# Patient Record
Sex: Female | Born: 2000 | Race: White | Hispanic: No | Marital: Married | Smoking: Current some day smoker
Health system: Southern US, Community
[De-identification: ages and names within clinical notes are randomized; demographics above are authoritative.]

## PROBLEM LIST (undated history)

## (undated) DIAGNOSIS — F32A Depression, unspecified: Secondary | ICD-10-CM

## (undated) DIAGNOSIS — F419 Anxiety disorder, unspecified: Secondary | ICD-10-CM

## (undated) HISTORY — DX: Depression, unspecified: F32.A

## (undated) HISTORY — DX: Anxiety disorder, unspecified: F41.9

---
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
## 2020-12-19 ENCOUNTER — Encounter: Payer: Self-pay | Admitting: Advanced Practice Midwife

## 2020-12-19 ENCOUNTER — Other Ambulatory Visit: Payer: Self-pay

## 2020-12-19 ENCOUNTER — Ambulatory Visit (INDEPENDENT_AMBULATORY_CARE_PROVIDER_SITE_OTHER): Payer: BC Managed Care – PPO | Admitting: Advanced Practice Midwife

## 2020-12-19 ENCOUNTER — Other Ambulatory Visit (HOSPITAL_COMMUNITY)
Admission: RE | Admit: 2020-12-19 | Discharge: 2020-12-19 | Disposition: A | Discharge status: Home / Self Care | Payer: BC Managed Care – PPO | Source: Ambulatory Visit | Attending: Advanced Practice Midwife | Admitting: Advanced Practice Midwife

## 2020-12-19 VITALS — BP 120/80 | Ht 68.0 in | Wt 161.0 lb

## 2020-12-19 DIAGNOSIS — Z30432 Encounter for removal of intrauterine contraceptive device: Secondary | ICD-10-CM

## 2020-12-19 DIAGNOSIS — Z124 Encounter for screening for malignant neoplasm of cervix: Secondary | ICD-10-CM | POA: Insufficient documentation

## 2020-12-19 DIAGNOSIS — Z30011 Encounter for initial prescription of contraceptive pills: Secondary | ICD-10-CM

## 2020-12-19 MED ORDER — NORGESTIMATE-ETH ESTRADIOL 0.25-35 MG-MCG PO TABS: 1.0000 | ORAL_TABLET | Freq: Every day | ORAL | 11 refills | Status: DC

## 2020-12-19 NOTE — Progress Notes (Signed)
Patient ID: Danielle Sheppard, female   DOB: [birth year], 20 y.o.   MRN: [id number]  Reason for Consult: Contraception   Subjective:  HPI:  Danielle Sheppard is a 20 y.o. female being seen for removal of Mirena IUD that was placed 6 years ago after the birth of her first baby. She was not aware of the new 7 year approval, however, she would still like the IUD removed so she can start OCP which will give her more flexibility with regards to timing of conception. She accepts PAP smear today as she is now 58.   She mentions frequent UTIs and thinks she may have one now. Her symptoms started 2 months ago and are frequency and strong smelling/dark colored urine. She denies adequate hydration. We discussed her leaving a urine specimen for analysis and culture as needed and prescribing medication if UA indicates. She was agreeable, however, she did not leave a urine specimen. We contacted her after the visit and she prefers to try increasing hydration to recommended clear/light yellow before possibly coming back for UA.   History reviewed. No pertinent past medical history. History reviewed. No pertinent family history. Past Surgical History:  Procedure Laterality Date  . CESAREAN SECTION N/A     Short Social History:  Social History   Tobacco Use  . Smoking status: Current Some Day Smoker  . Smokeless tobacco: Never Used  Substance Use Topics  . Alcohol use: No    No Known Allergies  Current Outpatient Medications  Medication Sig Dispense Refill  . norgestimate-ethinyl estradiol (ORTHO-CYCLEN) 0.25-35 MG-MCG tablet Take 1 tablet by mouth daily. 30 tablet 11  . sertraline (ZOLOFT) 50 MG tablet Take 50 mg by mouth daily. (Patient not taking: Reported on 12/19/2020)    . traZODone (DESYREL) 50 MG tablet Take 1 tablet (50 mg total) by mouth at bedtime. (Patient not taking: Reported on 12/19/2020) 30 tablet 0   No current facility-administered medications for this visit.   Review of Systems   Constitutional: Negative for chills and fever.  HENT: Negative for congestion, ear discharge, ear pain, hearing loss, sinus pain and sore throat.   Eyes: Negative for blurred vision and double vision.  Respiratory: Negative for cough, shortness of breath and wheezing.   Cardiovascular: Negative for chest pain, palpitations and leg swelling.  Gastrointestinal: Negative for abdominal pain, blood in stool, constipation, diarrhea, heartburn, melena, nausea and vomiting.  Genitourinary: Positive for frequency. Negative for dysuria, flank pain, hematuria and urgency.       Positive for strong smelling/dark colored urine  Musculoskeletal: Negative for back pain, joint pain and myalgias.  Skin: Negative for itching and rash.  Neurological: Negative for dizziness, tingling, tremors, sensory change, speech change, focal weakness, seizures, loss of consciousness, weakness and headaches.  Endo/Heme/Allergies: Negative for environmental allergies. Does not bruise/bleed easily.  Psychiatric/Behavioral: Negative for depression, hallucinations, memory loss, substance abuse and suicidal ideas. The patient is not nervous/anxious and does not have insomnia.         Objective:  Objective   Vitals:   12/19/20 1042  BP: 120/80  Weight: 161 lb (73 kg)  Height: 5\' 8"  (1.727 m)   Body mass index is 24.48 kg/m. Constitutional: Well nourished, well developed female in no acute distress.  HEENT: normal Skin: Warm and dry.  Cardiovascular: Regular rate and rhythm.   Extremity: no edema  Respiratory: Clear to auscultation bilateral. Normal respiratory effort Abdomen: soft, nontender, nondistended, no abnormal masses, no epigastric pain Back: no CVAT Neuro: DTRs 2+,  Cranial nerves grossly intact Psych: Alert and Oriented x3. No memory deficits. Normal mood and affect.  MS: normal gait, normal bilateral lower extremity ROM/strength/stability.  Pelvic exam:  is not limited by body habitus EGBUS: within  normal limits Vagina: within normal limits and with normal mucosa  Cervix: normal appearance, IUD strings visualized and grasped with curved forceps. IUD easily removed intact. PAP smear specimen collected. Patient tolerated procedure well.    Assessment/Plan:     19 y.o. G1 P50 female IUD removal, start OCP birth control  Rx Sprintec PAP smear today Follow up as needed for UTI symptoms, increase hydration   Tresea Mall CNM [facility] 12/19/2020, 11:50 AM

## 2020-12-25 LAB — CYTOLOGY - PAP
Comment: NEGATIVE
Diagnosis: UNDETERMINED — AB
High risk HPV: NEGATIVE

## 2021-02-26 ENCOUNTER — Other Ambulatory Visit: Payer: Self-pay

## 2021-02-26 ENCOUNTER — Other Ambulatory Visit (HOSPITAL_COMMUNITY)
Admission: RE | Admit: 2021-02-26 | Discharge: 2021-02-26 | Disposition: A | Discharge status: Home / Self Care | Payer: BC Managed Care – PPO | Source: Ambulatory Visit | Attending: Obstetrics and Gynecology | Admitting: Obstetrics and Gynecology

## 2021-02-26 ENCOUNTER — Encounter: Payer: Self-pay | Admitting: Obstetrics and Gynecology

## 2021-02-26 ENCOUNTER — Ambulatory Visit (INDEPENDENT_AMBULATORY_CARE_PROVIDER_SITE_OTHER): Payer: BC Managed Care – PPO | Admitting: Obstetrics and Gynecology

## 2021-02-26 VITALS — BP 100/70 | Ht 68.0 in | Wt 154.0 lb

## 2021-02-26 DIAGNOSIS — Z113 Encounter for screening for infections with a predominantly sexual mode of transmission: Secondary | ICD-10-CM

## 2021-02-26 DIAGNOSIS — N921 Excessive and frequent menstruation with irregular cycle: Secondary | ICD-10-CM

## 2021-02-26 DIAGNOSIS — R350 Frequency of micturition: Secondary | ICD-10-CM | POA: Diagnosis not present

## 2021-02-26 LAB — POCT URINALYSIS DIPSTICK
Bilirubin, UA: NEGATIVE
Blood, UA: NEGATIVE
Glucose, UA: NEGATIVE
Ketones, UA: NEGATIVE
Leukocytes, UA: NEGATIVE
Nitrite, UA: NEGATIVE
Protein, UA: NEGATIVE
Spec Grav, UA: 1.02 (ref 1.010–1.025)
pH, UA: 6 (ref 5.0–8.0)

## 2021-02-26 NOTE — Progress Notes (Signed)
Patient, No Pcp Per (Inactive)   Chief Complaint  Patient presents with  . Vaginal Bleeding    Spotting and cramping, feels bloated, since May 30, neg UPT 3 days ago    HPI:      Danielle Sheppard is a 20 y.o. G1P1001 whose LMP was Patient's last menstrual period was 02/08/2021 (exact date)., presents today for BTB, cramping for a few days. Has urinary frequency with decreased flow, no dysuria, hematuria, LBP, fevers. Drinks a lot of caffeine. Has had mild diarrhea for a few days, no n/v. Menses were monthly, lasting 5 days, mod flow, no BTB, mild dysmen the past yr with IUD and since removal. IUD removed 3/22, planned to start OCPs. Took for 5 days, then stopped. Would rather not be on BC. Conception ok. Had neg UPT a few days ago. She is sex active with fiance, no new partners. No pain/bleeding usually with sex but did have some bleeding a couple days ago since spotting started.  Had ASCUS pap with neg HPV DNA 3/22 but no recent STD testing done.  Past Medical History:  Diagnosis Date  . Anxiety   . Depression     Past Surgical History:  Procedure Laterality Date  . CESAREAN SECTION N/A     History reviewed. No pertinent family history.  Social History   Socioeconomic History  . Marital status: Single    Spouse name: Not on file  . Number of children: Not on file  . Years of education: Not on file  . Highest education level: Not on file  Occupational History  . Not on file  Tobacco Use  . Smoking status: Current Some Day Smoker  . Smokeless tobacco: Never Used  Vaping Use  . Vaping Use: Every day  Substance and Sexual Activity  . Alcohol use: No  . Drug use: Never  . Sexual activity: Yes    Birth control/protection: None  Other Topics Concern  . Not on file  Social History Narrative  . Not on file   Social Determinants of Health   Financial Resource Strain: Not on file  Food Insecurity: Not on file  Transportation Needs: Not on file  Physical  Activity: Not on file  Stress: Not on file  Social Connections: Not on file  Intimate Partner Violence: Not on file    Outpatient Medications Prior to Visit  Medication Sig Dispense Refill  . norgestimate-ethinyl estradiol (ORTHO-CYCLEN) 0.25-35 MG-MCG tablet Take 1 tablet by mouth daily. (Patient not taking: Reported on 02/26/2021) 30 tablet 11  . sertraline (ZOLOFT) 50 MG tablet Take 50 mg by mouth daily. (Patient not taking: Reported on 12/19/2020)    . traZODone (DESYREL) 50 MG tablet Take 1 tablet (50 mg total) by mouth at bedtime. (Patient not taking: Reported on 12/19/2020) 30 tablet 0   No facility-administered medications prior to visit.      ROS:  Review of Systems  Constitutional: Negative for fever.  Gastrointestinal: Negative for blood in stool, constipation, diarrhea, nausea and vomiting.  Genitourinary: Positive for frequency, pelvic pain and vaginal bleeding. Negative for dyspareunia, dysuria, flank pain, hematuria, urgency, vaginal discharge and vaginal pain.  Musculoskeletal: Negative for back pain.  Skin: Negative for rash.    OBJECTIVE:   Vitals:  BP 100/70   Ht 5\' 8"  (1.727 m)   Wt 154 lb (69.9 kg)   LMP 02/08/2021 (Exact Date)   BMI 23.42 kg/m   Physical Exam Vitals reviewed.  Constitutional:  Appearance: She is well-developed.  Pulmonary:     Effort: Pulmonary effort is normal.  Genitourinary:    General: Normal vulva.     Pubic Area: No rash.      Labia:        Right: No rash, tenderness or lesion.        Left: No rash, tenderness or lesion.      Vagina: No vaginal discharge, erythema, tenderness or bleeding.     Cervix: Normal.     Uterus: Normal. Not enlarged and not tender.      Adnexa: Right adnexa normal and left adnexa normal.       Right: No mass or tenderness.         Left: No mass or tenderness.    Musculoskeletal:        General: Normal range of motion.     Cervical back: Normal range of motion.  Skin:    General: Skin is  warm and dry.  Neurological:     General: No focal deficit present.     Mental Status: She is alert and oriented to person, place, and time.  Psychiatric:        Mood and Affect: Mood normal.        Behavior: Behavior normal.        Thought Content: Thought content normal.        Judgment: Judgment normal.     Results: Results for orders placed or performed in visit on 02/26/21 (from the past 24 hour(s))  POCT Urinalysis Dipstick     Status: Normal   Collection Time: 02/26/21  2:44 PM  Result Value Ref Range   Color, UA yellow    Clarity, UA clear    Glucose, UA Negative Negative   Bilirubin, UA neg    Ketones, UA neg    Spec Grav, UA 1.020 1.010 - 1.025   Blood, UA neg    pH, UA 6.0 5.0 - 8.0   Protein, UA Negative Negative   Urobilinogen, UA     Nitrite, UA neg    Leukocytes, UA Negative Negative   Appearance     Odor       Assessment/Plan: Breakthrough bleeding - Plan: Cervicovaginal ancillary only; neg exam, no bleeding on exam. Rule out STDs. If neg, question ovulatory spotting. Check first AM UPT if no/light menses later this mo. Declines BC. F/u prn.   Screening for STD (sexually transmitted disease) - Plan: Cervicovaginal ancillary only  Urinary frequency - Plan: POCT Urinalysis Dipstick; neg UA. D/C caffeine, increase water. F/u prn.     Return if symptoms worsen or fail to improve.  Anny Sayler B. Kadance Mccuistion, PA-C 02/26/2021 2:47 PM

## 2021-02-26 NOTE — Patient Instructions (Signed)
I value your feedback and you entrusting us with your care. If you get a [facility] patient survey, I would appreciate you taking the time to let us know about your experience today. Thank you! ? ? ?

## 2021-02-28 ENCOUNTER — Encounter: Payer: Self-pay | Admitting: Obstetrics and Gynecology

## 2021-02-28 LAB — CERVICOVAGINAL ANCILLARY ONLY
Chlamydia: NEGATIVE
Comment: NEGATIVE
Comment: NORMAL
Neisseria Gonorrhea: NEGATIVE

## 2021-05-02 ENCOUNTER — Encounter: Payer: BC Managed Care – PPO | Admitting: Advanced Practice Midwife

## 2021-06-09 ENCOUNTER — Encounter: Payer: BC Managed Care – PPO | Admitting: Advanced Practice Midwife

## 2021-07-01 ENCOUNTER — Encounter: Payer: Self-pay | Admitting: Advanced Practice Midwife

## 2021-07-01 ENCOUNTER — Ambulatory Visit (INDEPENDENT_AMBULATORY_CARE_PROVIDER_SITE_OTHER): Payer: BC Managed Care – PPO | Admitting: Advanced Practice Midwife

## 2021-07-01 ENCOUNTER — Other Ambulatory Visit: Payer: Self-pay

## 2021-07-01 ENCOUNTER — Other Ambulatory Visit (HOSPITAL_COMMUNITY)
Admission: RE | Admit: 2021-07-01 | Discharge: 2021-07-01 | Disposition: A | Discharge status: Home / Self Care | Payer: BC Managed Care – PPO | Source: Ambulatory Visit | Attending: Advanced Practice Midwife | Admitting: Advanced Practice Midwife

## 2021-07-01 VITALS — BP 124/78 | HR 122 | Wt 172.0 lb

## 2021-07-01 DIAGNOSIS — Z113 Encounter for screening for infections with a predominantly sexual mode of transmission: Secondary | ICD-10-CM | POA: Diagnosis present

## 2021-07-01 DIAGNOSIS — Z3480 Encounter for supervision of other normal pregnancy, unspecified trimester: Secondary | ICD-10-CM | POA: Insufficient documentation

## 2021-07-01 DIAGNOSIS — N926 Irregular menstruation, unspecified: Secondary | ICD-10-CM

## 2021-07-01 DIAGNOSIS — Z369 Encounter for antenatal screening, unspecified: Secondary | ICD-10-CM | POA: Diagnosis present

## 2021-07-01 DIAGNOSIS — O99332 Smoking (tobacco) complicating pregnancy, second trimester: Secondary | ICD-10-CM

## 2021-07-01 DIAGNOSIS — Z3A20 20 weeks gestation of pregnancy: Secondary | ICD-10-CM

## 2021-07-01 LAB — POCT URINE PREGNANCY: Preg Test, Ur: POSITIVE — AB

## 2021-07-01 MED ORDER — NICOTINE 14 MG/24HR TD PT24: 14.0000 mg | MEDICATED_PATCH | Freq: Every day | TRANSDERMAL | 0 refills | Status: DC

## 2021-07-01 NOTE — Progress Notes (Signed)
NOB - no concerns. RM 5 °

## 2021-07-01 NOTE — Patient Instructions (Signed)

## 2021-07-02 NOTE — Progress Notes (Signed)
New Obstetric Patient H&P  Date of Service: 07/01/2021  Chief Complaint: "Desires prenatal care"   History of Present Illness: Patient is a 20 y.o. G2P1001 Not Hispanic or Latino female, presents with amenorrhea and positive home pregnancy test. Patient's last menstrual period was 02/08/2021 (within days). and based on her  LMP, her EDD is Estimated Date of Delivery: 11/15/21 and her EGA is [redacted]w[redacted]d. Cycles are 5 days, regular, and occur approximately every : 28 days. Her last pap smear was 6 months ago and was ASCUS with NEGATIVE high risk HPV.    She had a urine pregnancy test which was positive 2 or 3 month(s)  ago. Her last menstrual period was normal and lasted for  5 day(s). Since her LMP she claims she has experienced breast tenderness, fatigue, nausea, vomiting. She denies vaginal bleeding. Her past medical history is noncontributory. Her prior pregnancies are notable for  FT c/s for fetal heart rate indication, 2016, female 7#6oz.  Since her LMP, she admits to the use of tobacco products  she is vaping and is trying to quit- requesting assistance (patches) She claims she has gained  14  pounds since the start of her pregnancy.  There are cats in the home in the home  yes Her partner takes care of litter box She admits close contact with children on a regular basis  yes  She has had chicken pox in the past no She has had Tuberculosis exposures, symptoms, or previously tested positive for TB   no Current or past history of domestic violence. Past history- safe now  Genetic Screening/Teratology Counseling: (Includes patient, baby's father, or anyone in either family with:)   1. Patient's age >/= 42 at [facility]  no 2. Thalassemia ([place], [place], Mediterranean, or Asian background): MCV<80  no 3. Neural tube defect (meningomyelocele, spina bifida, anencephaly)  no 4. Congenital heart defect  no  5. Down syndrome  no 6. Tay-Sachs (Jewish, [place]  no 7. Canavan's Disease  no 8. Sickle  cell disease or trait (African)  no  9. Hemophilia or other blood disorders  no  10. Muscular dystrophy  no  11. Cystic fibrosis  no  12. Huntington's Chorea  no  13. Mental retardation/autism  no 14. Other inherited genetic or chromosomal disorder  no 15. Maternal metabolic disorder (DM, PKU, etc)  no 16. Patient or FOB with a child with a birth defect not listed above no  16a. Patient or FOB with a birth defect themselves no 17. Recurrent pregnancy loss, or stillbirth  no  18. Any medications since LMP other than prenatal vitamins (include vitamins, supplements, OTC meds, drugs, alcohol)  no 19. Any other genetic/environmental exposure to discuss  no  Infection History:   1. Lives with someone with TB or TB exposed  no  2. Patient or partner has history of genital herpes  no 3. Rash or viral illness since LMP  no 4. History of STI (GC, CT, HPV, syphilis, HIV)  no 5. History of recent travel :  no  Other pertinent information:  no     Review of Systems:10 point review of systems negative unless otherwise noted in HPI  Past Medical History:  Patient Active Problem List   Diagnosis Date Noted   Supervision of other normal pregnancy, antepartum 07/01/2021     Nursing Staff Provider  Office Location  [facility] Dating    Language  English Anatomy US    Flu Vaccine   Genetic Screen  NIPS:  TDaP vaccine    Hgb A1C or  GTT Early : NA Third trimester :   Covid    LAB RESULTS   Rhogam   Blood Type     Feeding Plan Breast Antibody    Contraception  Rubella    Circumcision  RPR     Pediatrician   HBsAg     Support Person Gerilyn Pilgrim HIV    Prenatal Classes  Varicella     GBS  (For PCN allergy, check sensitivities)   BTL Consent     VBAC Consent Primary 2016 Pap  ASCUS/-HPV PAP March '22    Hgb Electro    Pelvis Tested  CF      SMA             Past Surgical History:  Past Surgical History:  Procedure Laterality Date   CESAREAN SECTION N/A     Gynecologic History:  Patient's last menstrual period was 02/08/2021 (within days).  Obstetric History: G2P1001  Family History:  History reviewed. No pertinent family history.  Social History:  Social History   Socioeconomic History   Marital status: Married    Spouse name: Not on file   Number of children: Not on file   Years of education: Not on file   Highest education level: Not on file  Occupational History   Not on file  Tobacco Use   Smoking status: Some Days   Smokeless tobacco: Never  Vaping Use   Vaping Use: Every day  Substance and Sexual Activity   Alcohol use: No   Drug use: Never   Sexual activity: Yes    Birth control/protection: None  Other Topics Concern   Not on file  Social History Narrative   Not on file   Social Determinants of Health   Financial Resource Strain: Not on file  Food Insecurity: Not on file  Transportation Needs: Not on file  Physical Activity: Not on file  Stress: Not on file  Social Connections: Not on file  Intimate Partner Violence: Not on file    Allergies:  No Known Allergies  Medications: Prior to Admission medications   Medication Sig Start Date End Date Taking? Authorizing Provider  nicotine (NICODERM CQ) 14 mg/24hr patch Place 1 patch (14 mg total) onto the skin daily. 07/01/21  Yes Tresea Mall, CNM    Physical Exam Vitals: Blood pressure 124/78, pulse (!) 122, weight 172 lb (78 kg), last menstrual period 02/08/2021.  General: NAD HEENT: normocephalic, anicteric Thyroid: no enlargement, no palpable nodules Pulmonary: No increased work of breathing, CTAB Cardiovascular: RRR, distal pulses 2+ Abdomen: NABS, soft, non-tender, non-distended.  Umbilicus without lesions.  No hepatomegaly, splenomegaly or masses palpable. No evidence of hernia. FHTs by doppler 140s, FH: 19 Genitourinary:  External: Normal external female genitalia.  Normal urethral meatus, normal  Bartholin's and Skene's glands.    Vagina: Normal vaginal mucosa, no  evidence of prolapse.   Extremities: no edema, erythema, or tenderness Neurologic: Grossly intact Psychiatric: mood appropriate, affect full   The following were addressed during this visit:  Breastfeeding Education - Early initiation of breastfeeding    Comments: Keeps milk supply adequate, helps contract uterus and slow bleeding, and early milk is the perfect first food and is easy to digest.   - The importance of exclusive breastfeeding    Comments: Provides antibodies, Lower risk of breast and ovarian cancers, and type-2 diabetes,Helps your body recover, Reduced chance of SIDS.   - Risks of giving your baby anything other than  breast milk if you are breastfeeding    Comments: Make the baby less content with breastfeeds, may make my baby more susceptible to illness, and may reduce my milk supply.   - The importance of early skin-to-skin contact    Comments:  Keeps baby warm and secure, helps keep baby's blood sugar up and breathing steady, easier to bond and breastfeed, and helps calm baby.  - Rooming-in on a 24-hour basis    Comments: Easier to learn baby's feeding cues, easier to bond and get to know each other, and encourages milk production.   - Feeding on demand or baby-led feeding    Comments: Helps prevent breastfeeding complications, helps bring in good milk supply, prevents under or overfeeding, and helps baby feel content and satisfied   - Frequent feeding to help assure optimal milk production    Comments: Making a full supply of milk requires frequent removal of milk from breasts, infant will eat 8-12 times in 24 hours, if separated from infant use breast massage, hand expression and/ or pumping to remove milk from breasts.   - Effective positioning and attachment    Comments: Helps my baby to get enough breast milk, helps to produce an adequate milk supply, and helps prevent nipple pain and damage   - Exclusive breastfeeding for the first 6 months     Comments: Builds a healthy milk supply and keeps it up, protects baby from sickness and disease, and breastmilk has everything your baby needs for the first 6 months.   Assessment: 20 y.o. G2P1001 at [redacted]w[redacted]d presenting to initiate prenatal care  Plan: 1) Avoid alcoholic beverages. 2) Patient encouraged not to smoke.  3) Discontinue the use of all non-medicinal drugs and chemicals.  4) Take prenatal vitamins daily.  5) Nutrition, food safety (fish, cheese advisories, and high nitrite foods) and exercise discussed. 6) Hospital and practice style discussed with cross coverage system.  7) Genetic Screening, such as with 1st Trimester Screening, cell free fetal DNA, AFP testing, and Ultrasound, as well as with amniocentesis and CVS as appropriate, is discussed with patient. At the conclusion of today's visit patient requested genetic testing 8) Patient is asked about travel to areas at risk for the Zika virus, and counseled to avoid travel and exposure to mosquitoes or sexual partners who may have themselves been exposed to the virus. Testing is discussed, and will be ordered as appropriate.  9) Urine culture, aptima today 10) Return to clinic in 1 week for anatomy/dating scan and rob after, NOB labs inc MaterniT 21 11) Vaping cessation: Rx Nicoderm patch, do not use while vaping   Tresea Mall, CNM [facility] 07/02/2021, 2:02 PM

## 2021-07-03 LAB — CERVICOVAGINAL ANCILLARY ONLY
Chlamydia: NEGATIVE
Comment: NEGATIVE
Comment: NEGATIVE
Comment: NORMAL
Neisseria Gonorrhea: NEGATIVE
Trichomonas: NEGATIVE

## 2021-07-05 ENCOUNTER — Other Ambulatory Visit: Payer: Self-pay | Admitting: Advanced Practice Midwife

## 2021-07-05 DIAGNOSIS — O2342 Unspecified infection of urinary tract in pregnancy, second trimester: Secondary | ICD-10-CM

## 2021-07-05 LAB — URINE CULTURE

## 2021-07-05 MED ORDER — CEPHALEXIN 500 MG PO CAPS: 500.0000 mg | ORAL_CAPSULE | Freq: Three times a day (TID) | ORAL | 2 refills | Status: DC

## 2021-07-05 NOTE — Progress Notes (Signed)
Rx sent to treat E Coli and message sent to patient

## 2021-07-11 ENCOUNTER — Other Ambulatory Visit: Payer: Self-pay

## 2021-07-11 ENCOUNTER — Encounter: Payer: Self-pay | Admitting: Obstetrics & Gynecology

## 2021-07-11 ENCOUNTER — Ambulatory Visit (INDEPENDENT_AMBULATORY_CARE_PROVIDER_SITE_OTHER): Payer: BC Managed Care – PPO | Admitting: Obstetrics & Gynecology

## 2021-07-11 VITALS — BP 120/80 | Wt 177.0 lb

## 2021-07-11 DIAGNOSIS — Z3482 Encounter for supervision of other normal pregnancy, second trimester: Secondary | ICD-10-CM

## 2021-07-11 DIAGNOSIS — N926 Irregular menstruation, unspecified: Secondary | ICD-10-CM | POA: Diagnosis not present

## 2021-07-11 DIAGNOSIS — Z3480 Encounter for supervision of other normal pregnancy, unspecified trimester: Secondary | ICD-10-CM

## 2021-07-11 DIAGNOSIS — Z98891 History of uterine scar from previous surgery: Secondary | ICD-10-CM

## 2021-07-11 DIAGNOSIS — Z369 Encounter for antenatal screening, unspecified: Secondary | ICD-10-CM

## 2021-07-11 DIAGNOSIS — O0930 Supervision of pregnancy with insufficient antenatal care, unspecified trimester: Secondary | ICD-10-CM

## 2021-07-11 DIAGNOSIS — Z3A21 21 weeks gestation of pregnancy: Secondary | ICD-10-CM

## 2021-07-11 DIAGNOSIS — Z113 Encounter for screening for infections with a predominantly sexual mode of transmission: Secondary | ICD-10-CM

## 2021-07-11 NOTE — Addendum Note (Signed)
Addended by: Tresea Mall on: 07/11/2021 02:21 PM   Modules accepted: Orders

## 2021-07-11 NOTE — Progress Notes (Signed)
Prenatal Visit Note Date: 07/11/2021 Clinic: [facility]  Subjective:  Danielle Sheppard is a 20 y.o. G2P1001 at [redacted]w[redacted]d being seen today for ongoing prenatal care.  She is currently monitored for the following issues for this low-risk pregnancy and has Supervision of other normal pregnancy, antepartum; Late prenatal care; and History of cesarean delivery on their problem list.  Patient reports no complaints.    . Vag. Bleeding: None.  Movement: Absent. Denies leaking of fluid.   The following portions of the patient's history were reviewed and updated as appropriate: allergies, current medications, past family history, past medical history, past social history, past surgical history and problem list. Problem list updated.  Objective:   Vitals:   07/11/21 1408  BP: 120/80  Weight: 177 lb (80.3 kg)    Fetal Status: Fetal Heart Rate (bpm): 150   Movement: Absent     General:  Alert, oriented and cooperative. Patient is in no acute distress.  Skin: Skin is warm and dry. No rash noted.   Cardiovascular: Normal heart rate noted  Respiratory: Normal respiratory effort, no problems with respiration noted  Abdomen: Soft, gravid, appropriate for gestational age. Pain/Pressure: Present     Pelvic:  Cervical exam deferred        Extremities: Normal range of motion.     Mental Status: Normal mood and affect. Normal behavior. Normal judgment and thought content.   Urinalysis:      Assessment and Plan:  Pregnancy: G2P1001 at [redacted]w[redacted]d  1. Encounter for supervision of other normal pregnancy in second trimester PNV  2. Late prenatal care - Labs today - Hepatitis C antibody lab - Declines NIPT  3. History of cesarean delivery Discussed VBAC vs R CS.  Pt desires VBAC.  4. [redacted] weeks gestation of pregnancy - Cahnge EDC based on todays [place], so [redacted] weeks gestation  5. Irregular periods  6. Supervision of other normal pregnancy, antepartum - RPR+Rh+ABO+Rub Ab+Ab Scr+CB...  7. Screen for sexually  transmitted diseases - RPR+Rh+ABO+Rub Ab+Ab Scr+CB...  8. Antenatal screening encounter - RPR+Rh+ABO+Rub Ab+Ab Scr+CB...  Preterm labor symptoms and general obstetric precautions including but not limited to vaginal bleeding, contractions, leaking of fluid and fetal movement were reviewed in detail with the patient. Please refer to After Visit Summary for other counseling recommendations.   pregnancy 2 Problems (from 07/01/21 to present)     Problem     Supervision of other normal pregnancy, antepartum     Overview Addendum 07/02/2021  2:00 PM by Tresea Mall, CNM     Nursing Staff Provider  Office Location  [facility] Dating    Language  English Anatomy US    Flu Vaccine   Genetic Screen  NIPS:   TDaP vaccine    Hgb A1C or  GTT Early : NA Third trimester :   Covid    LAB RESULTS   Rhogam   Blood Type     Feeding Plan Breast Antibody    Contraception  Rubella    Circumcision  RPR     Pediatrician   HBsAg     Support Person Gerilyn Pilgrim HIV    Prenatal Classes  Varicella     GBS  (For PCN allergy, check sensitivities)   BTL Consent     VBAC Consent Primary 2016 Pap  ASCUS/-HPV PAP March '22    Hgb Electro    Pelvis Tested  CF      SMA  Return in about 4 weeks (around 08/08/2021) for ROB, also ANAT [place] ASAP.  Annamarie Major, MD, [facility], [facility] 07/11/2021  2:41 PM

## 2021-07-11 NOTE — Progress Notes (Signed)
ULTRASOUND REPORT  Location: [facility] Date of Service: 07/11/2021   Indications: Dating ultrasound  Findings:  Singleton intrauterine pregnancy is visualized with FHR at 150 BPM.  Biometry c/w 19 weeks and [facility] 12/05/21  Fetal presentation is Cephalic.   Placenta: fundal. Grade: 1  AFI: subjectively normal. Fetal movements present  There is no free peritoneal fluid in the cul de sac.  Impression: 1. [redacted]w[redacted]d Viable Singleton Intrauterine pregnancy  Not consistent with dates (EDC 11/15/21 by irreg menses and dates) Late onset to care; accuracy of [place] discussed Will plan to CHANGE EDC to 12/05/2021  Recommendations: 1.Clinical correlation with the patient's History and Physical Exam.  Letitia Libra, MD

## 2021-07-11 NOTE — Patient Instructions (Signed)

## 2021-07-12 LAB — RPR+RH+ABO+RUB AB+AB SCR+CB...
Antibody Screen: NEGATIVE
HIV Screen 4th Generation wRfx: NONREACTIVE
Hematocrit: 39.1 % (ref 34.0–46.6)
Hemoglobin: 13.4 g/dL (ref 11.1–15.9)
Hepatitis B Surface Ag: NEGATIVE
MCH: 30.5 pg (ref 26.6–33.0)
MCHC: 34.3 g/dL (ref 31.5–35.7)
MCV: 89 fL (ref 79–97)
Platelets: 243 10*3/uL (ref 150–450)
RBC: 4.39 x10E6/uL (ref 3.77–5.28)
RDW: 12.2 % (ref 11.7–15.4)
RPR Ser Ql: NONREACTIVE
Rh Factor: POSITIVE
Rubella Antibodies, IGG: 4.24 index (ref >0.99)
Varicella zoster IgG: <135 index — ABNORMAL LOW (ref >165)
WBC: 11.2 10*3/uL — ABNORMAL HIGH (ref 3.4–10.8)

## 2021-07-12 LAB — HEPATITIS C ANTIBODY: Hep C Virus Ab: <0.1 s/co ratio (ref 0.0–0.9)

## 2021-07-17 ENCOUNTER — Ambulatory Visit
Admission: RE | Admit: 2021-07-17 | Discharge: 2021-07-17 | Disposition: A | Discharge status: Home / Self Care | Payer: BC Managed Care – PPO | Source: Ambulatory Visit | Attending: Advanced Practice Midwife | Admitting: Advanced Practice Midwife

## 2021-07-17 ENCOUNTER — Other Ambulatory Visit: Payer: Self-pay

## 2021-07-17 DIAGNOSIS — Z369 Encounter for antenatal screening, unspecified: Secondary | ICD-10-CM | POA: Diagnosis present

## 2021-07-17 DIAGNOSIS — Z3480 Encounter for supervision of other normal pregnancy, unspecified trimester: Secondary | ICD-10-CM | POA: Insufficient documentation

## 2021-07-18 ENCOUNTER — Encounter: Payer: BC Managed Care – PPO | Admitting: Obstetrics

## 2021-07-21 ENCOUNTER — Encounter: Payer: BC Managed Care – PPO | Admitting: Obstetrics

## 2021-07-30 ENCOUNTER — Telehealth: Payer: Self-pay

## 2021-07-30 NOTE — Telephone Encounter (Signed)
The black stools is likely from the pepto and should get better in the next week with stopping the medication. If it does not we can discuss more on the 11th ath her ROM. Thanks

## 2021-07-30 NOTE — Telephone Encounter (Signed)
Pt calling; 22wks; last appt was told she has E. Coli; was given rx for it; last two days she has had solid black stools; thought it might be from her taking Pepto b/c she couldn't hold anything down; but the same thing happened today.  What to do?  [phone]

## 2021-07-30 NOTE — Telephone Encounter (Signed)
Pt aware.

## 2021-08-08 ENCOUNTER — Other Ambulatory Visit: Payer: Self-pay

## 2021-08-08 ENCOUNTER — Ambulatory Visit (INDEPENDENT_AMBULATORY_CARE_PROVIDER_SITE_OTHER): Payer: BC Managed Care – PPO | Admitting: Obstetrics and Gynecology

## 2021-08-08 ENCOUNTER — Encounter: Payer: Self-pay | Admitting: Obstetrics and Gynecology

## 2021-08-08 VITALS — BP 110/70 | Ht 68.0 in | Wt 183.6 lb

## 2021-08-08 DIAGNOSIS — Z3482 Encounter for supervision of other normal pregnancy, second trimester: Secondary | ICD-10-CM

## 2021-08-08 DIAGNOSIS — R300 Dysuria: Secondary | ICD-10-CM

## 2021-08-08 DIAGNOSIS — Z3480 Encounter for supervision of other normal pregnancy, unspecified trimester: Secondary | ICD-10-CM

## 2021-08-08 DIAGNOSIS — Z23 Encounter for immunization: Secondary | ICD-10-CM | POA: Diagnosis not present

## 2021-08-08 DIAGNOSIS — Z3A23 23 weeks gestation of pregnancy: Secondary | ICD-10-CM

## 2021-08-08 DIAGNOSIS — N760 Acute vaginitis: Secondary | ICD-10-CM

## 2021-08-08 LAB — POCT URINALYSIS DIPSTICK
Bilirubin, UA: NEGATIVE
Blood, UA: POSITIVE
Glucose, UA: NEGATIVE
Ketones, UA: NEGATIVE
Nitrite, UA: POSITIVE
Protein, UA: POSITIVE — AB
Spec Grav, UA: >=1.03 — AB (ref 1.010–1.025)
Urobilinogen, UA: 0.2 E.U./dL
pH, UA: 5 (ref 5.0–8.0)

## 2021-08-08 MED ORDER — TERCONAZOLE 0.4 % VA CREA: 1.0000 | TOPICAL_CREAM | Freq: Every day | VAGINAL | 0 refills | Status: DC

## 2021-08-08 MED ORDER — NITROFURANTOIN MONOHYD MACRO 100 MG PO CAPS: 100.0000 mg | ORAL_CAPSULE | Freq: Two times a day (BID) | ORAL | 0 refills | Status: AC

## 2021-08-08 NOTE — Patient Instructions (Signed)

## 2021-08-08 NOTE — Progress Notes (Signed)
Routine Prenatal Care Visit  Subjective  Danielle Sheppard is a 20 y.o. G2P1001 at [redacted]w[redacted]d being seen today for ongoing prenatal care.  She is currently monitored for the following issues for this low-risk pregnancy and has Supervision of other normal pregnancy, antepartum; Late prenatal care; and History of cesarean delivery on their problem list.  ----------------------------------------------------------------------------------- Patient reports  painful urination .    . Vag. Bleeding: None.  Movement: Present. Denies leaking of fluid.  ----------------------------------------------------------------------------------- The following portions of the patient's history were reviewed and updated as appropriate: allergies, current medications, past family history, past medical history, past social history, past surgical history and problem list. Problem list updated.   Objective  Blood pressure 110/70, height 5\' 8"  (1.727 m), weight 183 lb 9.6 oz (83.3 kg), last menstrual period 02/08/2021. Pregravid weight 158 lb (71.7 kg) Total Weight Gain 25 lb 9.6 oz (11.6 kg) Urinalysis:      Fetal Status:     Movement: Present     General:  Alert, oriented and cooperative. Patient is in no acute distress.  Skin: Skin is warm and dry. No rash noted.   Cardiovascular: Normal heart rate noted  Respiratory: Normal respiratory effort, no problems with respiration noted  Abdomen: Soft, gravid, appropriate for gestational age. Pain/Pressure: Present     Pelvic:  Cervical exam deferred        Extremities: Normal range of motion.     Mental Status: Normal mood and affect. Normal behavior. Normal judgment and thought content.     Assessment   20 y.o. G2P1001 at [redacted]w[redacted]d by  12/05/2021, by Ultrasound presenting for routine prenatal visit  Plan    pregnancy 2 Problems (from 07/01/21 to present)     Problem Noted Resolved   Late prenatal care 07/11/2021 by 07/13/2021, MD No   History of cesarean  delivery 07/11/2021 by 07/13/2021, MD No   Overview Signed 07/11/2021  2:43 PM by 07/13/2021, MD    OB/GYN  Counseling Note  20 y.o. G2P1001 at [redacted]w[redacted]d with Estimated Date of Delivery: 12/05/21 was seen today in office to discuss trial of labor after cesarean section (TOLAC) versus elective repeat cesarean delivery (ERCD). The following risks were discussed with the patient.  Risk of uterine rupture at term is 0.78 percent with TOLAC and 0.22 percent with ERCD. 1 in 10 uterine ruptures will result in neonatal death or neurological injury. The benefits of a trial of labor after cesarean (TOLAC) resulting in a vaginal birth after cesarean (VBAC) include the following: shorter length of hospital stay and postpartum recovery (in most cases); fewer complications, such as postpartum fever, wound or uterine infection, thromboembolism (blood clots in the leg or lung), need for blood transfusion and fewer neonatal breathing problems.  The risks of an attempted VBAC or TOLAC include the following: Risk of failed trial of labor after cesarean (TOLAC) without a vaginal birth after cesarean (VBAC) resulting in repeat cesarean delivery (RCD) in about 20 to 40 percent of women who attempt VBAC.  Risk of rupture of uterus resulting in an emergency cesarean delivery. The risk of uterine rupture may be related in part to the type of uterine incision made during the first cesarean delivery. A previous transverse uterine incision has the lowest risk of rupture (0.2 to 1.5 percent risk). Vertical or T-shaped uterine incisions have a higher risk of uterine rupture (4 to 9 percent risk)The risk of fetal death is very low with both VBAC and elective repeat  cesarean delivery (ERCD), but the likelihood of fetal death is higher with VBAC than with ERCD. Maternal death is very rare with either type of delivery.  The risks of an elective repeat cesarean delivery (ERCD) were reviewed with the patient including but not  limited to: 10/998 risk of uterine rupture which could have serious consequences, bleeding which may require transfusion; infection which may require antibiotics; injury to bowel, bladder or other surrounding organs (bowel, bladder, ureters); injury to the fetus; need for additional procedures including hysterectomy in the event of a life-threatening hemorrhage; thromboembolic phenomenon; abnormal placentation; incisional problems; death and other postoperative or anesthesia complications.    Desires VBAC      Supervision of other normal pregnancy, antepartum 07/01/2021 by Tresea Mall, CNM No   Overview Addendum 08/08/2021 10:00 AM by Natale Milch, MD     Nursing Staff Provider  Office Location  [facility] Dating  By [place] at 19 wks  Language  English Anatomy US  complete  Flu Vaccine   Genetic Screen  NIPS: declines  TDaP vaccine    Hgb A1C or  GTT Early : NA Third trimester :   Covid    LAB RESULTS   Rhogam   Blood Type A/Positive/-- (10/14 1438)   Feeding Plan Breast Antibody Negative (10/14 1438)  Contraception  Rubella 4.24 (10/14 1438)  Circumcision  RPR Non Reactive (10/14 1438)   Pediatrician   HBsAg Negative (10/14 1438)   Support Person Gerilyn Pilgrim HIV Non Reactive (10/14 1438)  Prenatal Classes  Varicella  non immune    GBS  (For PCN allergy, check sensitivities)   BTL Consent No    VBAC Consent Primary 2016 Pap  ASCUS/-HPV PAP March '22    Hgb Electro    Pelvis Tested  CF      SMA                   Flu shot today Rx for Macrobid sent for suspected UTI, urine culture sent.  Terazol sent since patient reports yeast infections with antibiotics.  Gestational age appropriate obstetric precautions including but not limited to vaginal bleeding, contractions, leaking of fluid and fetal movement were reviewed in detail with the patient.    Return in about 4 weeks (around 09/05/2021) for ROB and 1 GTT.  Natale Milch MD [facility], [facility] 08/08/2021, 9:53 AM

## 2021-08-14 LAB — URINE CULTURE

## 2021-09-05 ENCOUNTER — Other Ambulatory Visit: Payer: BC Managed Care – PPO

## 2021-09-05 ENCOUNTER — Ambulatory Visit (INDEPENDENT_AMBULATORY_CARE_PROVIDER_SITE_OTHER): Payer: BC Managed Care – PPO | Admitting: Obstetrics

## 2021-09-05 ENCOUNTER — Other Ambulatory Visit: Payer: Self-pay

## 2021-09-05 VITALS — BP 118/68 | Wt 198.0 lb

## 2021-09-05 DIAGNOSIS — Z3482 Encounter for supervision of other normal pregnancy, second trimester: Secondary | ICD-10-CM

## 2021-09-05 DIAGNOSIS — Z3480 Encounter for supervision of other normal pregnancy, unspecified trimester: Secondary | ICD-10-CM

## 2021-09-05 DIAGNOSIS — Z3A27 27 weeks gestation of pregnancy: Secondary | ICD-10-CM

## 2021-09-05 LAB — POCT URINALYSIS DIPSTICK OB
Glucose, UA: NEGATIVE
POC,PROTEIN,UA: NEGATIVE

## 2021-09-05 NOTE — Progress Notes (Signed)
ROB - 1hr gtt, no concerns. RM 8

## 2021-09-05 NOTE — Progress Notes (Signed)
Routine Prenatal Care Visit  Subjective  Danielle Sheppard is a 20 y.o. G2P1001 at [redacted]w[redacted]d being seen today for ongoing prenatal care.  She is currently monitored for the following issues for this low-risk pregnancy and has Supervision of other normal pregnancy, antepartum; Late prenatal care; and History of cesarean delivery on their problem list.  ----------------------------------------------------------------------------------- Patient reports no complaints She has some fears about VBAC. Hearing scary stories about vaginal births from friends and family..    .  .   Pincus Large Fluid denies.  ----------------------------------------------------------------------------------- The following portions of the patient's history were reviewed and updated as appropriate: allergies, current medications, past family history, past medical history, past social history, past surgical history and problem list. Problem list updated.  Objective  Blood pressure 118/68, weight 198 lb (89.8 kg), last menstrual period 02/08/2021. Pregravid weight 158 lb (71.7 kg) Total Weight Gain 40 lb (18.1 kg) Urinalysis: Urine Protein Negative  Urine Glucose Negative  Fetal Status:           General:  Alert, oriented and cooperative. Patient is in no acute distress.  Skin: Skin is warm and dry. No rash noted.   Cardiovascular: Normal heart rate noted  Respiratory: Normal respiratory effort, no problems with respiration noted  Abdomen: Soft, gravid, appropriate for gestational age.       Pelvic:  Cervical exam deferred        Extremities: Normal range of motion.     Mental Status: Normal mood and affect. Normal behavior. Normal judgment and thought content.   Assessment   20 y.o. G2P1001 at [redacted]w[redacted]d by  12/05/2021, by Ultrasound presenting for routine prenatal visit  Plan   pregnancy 2 Problems (from 07/01/21 to present)    Problem Noted Resolved   Late prenatal care 07/11/2021 by Nadara Mustard, MD No   History of  cesarean delivery 07/11/2021 by Nadara Mustard, MD No   Overview Signed 07/11/2021  2:43 PM by Nadara Mustard, MD    OB/GYN  Counseling Note  20 y.o. G2P1001 at [redacted]w[redacted]d with Estimated Date of Delivery: 12/05/21 was seen today in office to discuss trial of labor after cesarean section (TOLAC) versus elective repeat cesarean delivery (ERCD). The following risks were discussed with the patient.  Risk of uterine rupture at term is 0.78 percent with TOLAC and 0.22 percent with ERCD. 1 in 10 uterine ruptures will result in neonatal death or neurological injury. The benefits of a trial of labor after cesarean (TOLAC) resulting in a vaginal birth after cesarean (VBAC) include the following: shorter length of hospital stay and postpartum recovery (in most cases); fewer complications, such as postpartum fever, wound or uterine infection, thromboembolism (blood clots in the leg or lung), need for blood transfusion and fewer neonatal breathing problems.  The risks of an attempted VBAC or TOLAC include the following: Risk of failed trial of labor after cesarean (TOLAC) without a vaginal birth after cesarean (VBAC) resulting in repeat cesarean delivery (RCD) in about 20 to 40 percent of women who attempt VBAC.  Risk of rupture of uterus resulting in an emergency cesarean delivery. The risk of uterine rupture may be related in part to the type of uterine incision made during the first cesarean delivery. A previous transverse uterine incision has the lowest risk of rupture (0.2 to 1.5 percent risk). Vertical or T-shaped uterine incisions have a higher risk of uterine rupture (4 to 9 percent risk)The risk of fetal death is very low with both VBAC and elective repeat cesarean  delivery (ERCD), but the likelihood of fetal death is higher with VBAC than with ERCD. Maternal death is very rare with either type of delivery.  The risks of an elective repeat cesarean delivery (ERCD) were reviewed with the patient including but  not limited to: 10/998 risk of uterine rupture which could have serious consequences, bleeding which may require transfusion; infection which may require antibiotics; injury to bowel, bladder or other surrounding organs (bowel, bladder, ureters); injury to the fetus; need for additional procedures including hysterectomy in the event of a life-threatening hemorrhage; thromboembolic phenomenon; abnormal placentation; incisional problems; death and other postoperative or anesthesia complications.    Desires VBAC      Supervision of other normal pregnancy, antepartum 07/01/2021 by Tresea Mall, CNM No   Overview Addendum 08/08/2021 10:00 AM by Natale Milch, MD     Nursing Staff Provider  Office Location  [facility] Dating  By [place] at 19 wks  Language  English Anatomy US  complete  Flu Vaccine  08/08/2021 Genetic Screen  NIPS: declines  TDaP vaccine    Hgb A1C or  GTT Early : NA Third trimester :   Covid    LAB RESULTS   Rhogam   Blood Type A/Positive/-- (10/14 1438)   Feeding Plan Breast Antibody Negative (10/14 1438)  Contraception  Rubella 4.24 (10/14 1438)  Circumcision  RPR Non Reactive (10/14 1438)   Pediatrician   HBsAg Negative (10/14 1438)   Support Person Gerilyn Pilgrim HIV Non Reactive (10/14 1438)  Prenatal Classes  Varicella  non immune    GBS  (For PCN allergy, check sensitivities)   BTL Consent No    VBAC Consent Primary 2016 Pap  ASCUS/-HPV PAP March '22    Hgb Electro    Pelvis Tested  CF      SMA                   Preterm labor symptoms and general obstetric precautions including but not limited to vaginal bleeding, contractions, leaking of fluid and fetal movement were reviewed in detail with the patient. Please refer to After Visit Summary for other counseling recommendations.  28 week labs today. Return in about 2 weeks (around 09/19/2021) for return OB.  Mirna Mires, CNM  09/05/2021 8:43 AM

## 2021-09-06 LAB — 28 WEEK RH+PANEL
Basophils Absolute: 0 10*3/uL (ref 0.0–0.2)
Basos: 0 %
EOS (ABSOLUTE): 0.1 10*3/uL (ref 0.0–0.4)
Eos: 1 %
Gestational Diabetes Screen: 73 mg/dL (ref 70–139)
HIV Screen 4th Generation wRfx: NONREACTIVE
Hematocrit: 38.5 % (ref 34.0–46.6)
Hemoglobin: 13.4 g/dL (ref 11.1–15.9)
Immature Grans (Abs): 0 10*3/uL (ref 0.0–0.1)
Immature Granulocytes: 0 %
Lymphocytes Absolute: 1.3 10*3/uL (ref 0.7–3.1)
Lymphs: 15 %
MCH: 31 pg (ref 26.6–33.0)
MCHC: 34.8 g/dL (ref 31.5–35.7)
MCV: 89 fL (ref 79–97)
Monocytes Absolute: 0.6 10*3/uL (ref 0.1–0.9)
Monocytes: 7 %
Neutrophils Absolute: 6.5 10*3/uL (ref 1.4–7.0)
Neutrophils: 77 %
Platelets: 212 10*3/uL (ref 150–450)
RBC: 4.32 x10E6/uL (ref 3.77–5.28)
RDW: 12.6 % (ref 11.7–15.4)
RPR Ser Ql: NONREACTIVE
WBC: 8.6 10*3/uL (ref 3.4–10.8)

## 2021-09-19 ENCOUNTER — Encounter: Payer: BC Managed Care – PPO | Admitting: Obstetrics

## 2021-09-28 NOTE — L&D Delivery Note (Signed)
Vaginal Delivery Note ? ?Spontaneous delivery of live viable female infant from the ROA position through an intact perineum. Delivery of anterior left shoulder with gentle downward guidance followed by delivery of the right posterior shoulder with gentle upward guidance. Body followed spontaneously. Infant placed on maternal chest. Nursery present and helped with neonatal resuscitation and evaluation. Cord clamped and cut after one minute. Cord blood not collected. Placenta delivered spontaneously and intact with a 3 vessel cord.  Bilateral periurethral laceration. Uterus firm and below umbilicus at the end of the delivery.  Mom and baby recovering in stable condition. Sponge and needle counts were correct at the end of the delivery. ? ?APGARS: 1 minute:8 5 minutes: 9 ?Weight: pending ?Epidural present ?Methergine, Cytotec, and Hemabate given for postpartum hemorrhage  ? ?Adelene Idler MD ?[facility], [facility] ?12/06/21 ?2:17 PM ? ?

## 2021-10-13 ENCOUNTER — Other Ambulatory Visit: Payer: Self-pay

## 2021-10-13 ENCOUNTER — Ambulatory Visit (INDEPENDENT_AMBULATORY_CARE_PROVIDER_SITE_OTHER): Payer: BC Managed Care – PPO | Admitting: Obstetrics

## 2021-10-13 VITALS — BP 122/74 | Wt 206.0 lb

## 2021-10-13 DIAGNOSIS — Z23 Encounter for immunization: Secondary | ICD-10-CM

## 2021-10-13 DIAGNOSIS — Z3A32 32 weeks gestation of pregnancy: Secondary | ICD-10-CM

## 2021-10-13 DIAGNOSIS — Z3480 Encounter for supervision of other normal pregnancy, unspecified trimester: Secondary | ICD-10-CM

## 2021-10-13 NOTE — Progress Notes (Signed)
No vb. No lof.  

## 2021-10-13 NOTE — Progress Notes (Signed)
Routine Prenatal Care Visit  Subjective  Danielle Sheppard is a 21 y.o. G2P1001 at [redacted]w[redacted]d being seen today for ongoing prenatal care.  She is currently monitored for the following issues for this low-risk pregnancy and has Supervision of other normal pregnancy, antepartum; Late prenatal care; and History of cesarean delivery on their problem list.  ----------------------------------------------------------------------------------- Patient reports no complaints.  She plans a VBAC.  Is looking for a breast pump. Was not aware that her insurance would provide one.  . Vag. Bleeding: None.   . Leaking Fluid denies.  ----------------------------------------------------------------------------------- The following portions of the patient's history were reviewed and updated as appropriate: allergies, current medications, past family history, past medical history, past social history, past surgical history and problem list. Problem list updated.  Objective  Blood pressure 122/74, weight 206 lb (93.4 kg), last menstrual period 02/08/2021. Pregravid weight 158 lb (71.7 kg) Total Weight Gain 48 lb (21.8 kg) Urinalysis: Urine Protein    Urine Glucose    Fetal Status:           General:  Alert, oriented and cooperative. Patient is in no acute distress.  Skin: Skin is warm and dry. No rash noted.   Cardiovascular: Normal heart rate noted  Respiratory: Normal respiratory effort, no problems with respiration noted  Abdomen: Soft, gravid, appropriate for gestational age. Pain/Pressure: Absent     Pelvic:  Cervical exam deferred        Extremities: Normal range of motion.     Mental Status: Normal mood and affect. Normal behavior. Normal judgment and thought content.   Assessment   21 y.o. G2P1001 at [redacted]w[redacted]d by  12/05/2021, by Ultrasound presenting for routine prenatal visit  Plan   pregnancy 2 Problems (from 07/01/21 to present)    Problem Noted Resolved   Late prenatal care 07/11/2021 by Nadara Mustard, MD No   History of cesarean delivery 07/11/2021 by Nadara Mustard, MD No   Overview Signed 07/11/2021  2:43 PM by Nadara Mustard, MD    OB/GYN  Counseling Note  21 y.o. G2P1001 at [redacted]w[redacted]d with Estimated Date of Delivery: 12/05/21 was seen today in office to discuss trial of labor after cesarean section (TOLAC) versus elective repeat cesarean delivery (ERCD). The following risks were discussed with the patient.  Risk of uterine rupture at term is 0.78 percent with TOLAC and 0.22 percent with ERCD. 1 in 10 uterine ruptures will result in neonatal death or neurological injury. The benefits of a trial of labor after cesarean (TOLAC) resulting in a vaginal birth after cesarean (VBAC) include the following: shorter length of hospital stay and postpartum recovery (in most cases); fewer complications, such as postpartum fever, wound or uterine infection, thromboembolism (blood clots in the leg or lung), need for blood transfusion and fewer neonatal breathing problems.  The risks of an attempted VBAC or TOLAC include the following: Risk of failed trial of labor after cesarean (TOLAC) without a vaginal birth after cesarean (VBAC) resulting in repeat cesarean delivery (RCD) in about 20 to 40 percent of women who attempt VBAC.  Risk of rupture of uterus resulting in an emergency cesarean delivery. The risk of uterine rupture may be related in part to the type of uterine incision made during the first cesarean delivery. A previous transverse uterine incision has the lowest risk of rupture (0.2 to 1.5 percent risk). Vertical or T-shaped uterine incisions have a higher risk of uterine rupture (4 to 9 percent risk)The risk of fetal death is very low with  both VBAC and elective repeat cesarean delivery (ERCD), but the likelihood of fetal death is higher with VBAC than with ERCD. Maternal death is very rare with either type of delivery.  The risks of an elective repeat cesarean delivery (ERCD) were reviewed with the  patient including but not limited to: 10/998 risk of uterine rupture which could have serious consequences, bleeding which may require transfusion; infection which may require antibiotics; injury to bowel, bladder or other surrounding organs (bowel, bladder, ureters); injury to the fetus; need for additional procedures including hysterectomy in the event of a life-threatening hemorrhage; thromboembolic phenomenon; abnormal placentation; incisional problems; death and other postoperative or anesthesia complications.    Desires VBAC      Supervision of other normal pregnancy, antepartum 07/01/2021 by Tresea Mall, CNM No   Overview Addendum 10/13/2021  1:30 PM by Mirna Mires, CNM     Nursing Staff Provider  Office Location  [facility] Dating  By [place] at 19 wks  Language  English Anatomy US  complete  Flu Vaccine  08/08/2021 Genetic Screen  NIPS: declines  TDaP vaccine    Hgb A1C or  GTT Early : NA Third trimester : 73  Covid    LAB RESULTS   Rhogam   Blood Type A/Positive/-- (10/14 1438)   Feeding Plan Breast Antibody Negative (10/14 1438)  Contraception  Rubella 4.24 (10/14 1438)  Circumcision  RPR Non Reactive (10/14 1438)   Pediatrician   HBsAg Negative (10/14 1438)   Support Person Danielle Sheppard HIV Non Reactive (10/14 1438)  Prenatal Classes  Varicella  non immune    GBS  (For PCN allergy, check sensitivities)   BTL Consent No    VBAC Consent Primary 2016 Pap  ASCUS/-HPV PAP March '22    Hgb Electro    Pelvis Tested  CF      SMA                   Preterm labor symptoms and general obstetric precautions including but not limited to vaginal bleeding, contractions, leaking of fluid and fetal movement were reviewed in detail with the patient. Please refer to After Visit Summary for other counseling recommendations.   Return in about 2 weeks (around 10/27/2021) for return OB.  Mirna Mires, CNM  10/13/2021 1:31 PM

## 2021-10-27 ENCOUNTER — Other Ambulatory Visit: Payer: Self-pay

## 2021-10-27 ENCOUNTER — Ambulatory Visit (INDEPENDENT_AMBULATORY_CARE_PROVIDER_SITE_OTHER): Payer: BC Managed Care – PPO | Admitting: Obstetrics

## 2021-10-27 VITALS — BP 126/84 | Wt 214.0 lb

## 2021-10-27 DIAGNOSIS — Z3480 Encounter for supervision of other normal pregnancy, unspecified trimester: Secondary | ICD-10-CM

## 2021-10-27 DIAGNOSIS — Z3A34 34 weeks gestation of pregnancy: Secondary | ICD-10-CM

## 2021-10-27 DIAGNOSIS — Z113 Encounter for screening for infections with a predominantly sexual mode of transmission: Secondary | ICD-10-CM

## 2021-10-27 NOTE — Progress Notes (Signed)
No vb. No lof.  

## 2021-10-27 NOTE — Progress Notes (Signed)
Routine Prenatal Care Visit  Subjective  Danielle Sheppard is a 21 y.o. G2P1001 at [redacted]w[redacted]d being seen today for ongoing prenatal care.  She is currently monitored for the following issues for this low-risk pregnancy and has Supervision of other normal pregnancy, antepartum; Late prenatal care; and History of cesarean delivery on their problem list.  ----------------------------------------------------------------------------------- Patient reports no complaints.    . Vag. Bleeding: None.  Movement: Present. Leaking Fluid denies.  ----------------------------------------------------------------------------------- The following portions of the patient's history were reviewed and updated as appropriate: allergies, current medications, past family history, past medical history, past social history, past surgical history and problem list. Problem list updated.  Objective  Blood pressure 126/84, weight 214 lb (97.1 kg), last menstrual period 02/08/2021. Pregravid weight 158 lb (71.7 kg) Total Weight Gain 56 lb (25.4 kg) Urinalysis: Urine Protein    Urine Glucose    Fetal Status:     Movement: Present     General:  Alert, oriented and cooperative. Patient is in no acute distress.  Skin: Skin is warm and dry. No rash noted.   Cardiovascular: Normal heart rate noted  Respiratory: Normal respiratory effort, no problems with respiration noted  Abdomen: Soft, gravid, appropriate for gestational age. Pain/Pressure: Absent     Pelvic:  Cervical exam deferred        Extremities: Normal range of motion.     Mental Status: Normal mood and affect. Normal behavior. Normal judgment and thought content.   Assessment   21 y.o. G2P1001 at [redacted]w[redacted]d by  12/05/2021, by Ultrasound presenting for routine prenatal visit  Plan   pregnancy 2 Problems (from 07/01/21 to present)    Problem Noted Resolved   Late prenatal care 07/11/2021 by Nadara Mustard, MD No   History of cesarean delivery 07/11/2021 by Nadara Mustard, MD No   Overview Signed 07/11/2021  2:43 PM by Nadara Mustard, MD    OB/GYN  Counseling Note  21 y.o. G2P1001 at [redacted]w[redacted]d with Estimated Date of Delivery: 12/05/21 was seen today in office to discuss trial of labor after cesarean section (TOLAC) versus elective repeat cesarean delivery (ERCD). The following risks were discussed with the patient.  Risk of uterine rupture at term is 0.78 percent with TOLAC and 0.22 percent with ERCD. 1 in 10 uterine ruptures will result in neonatal death or neurological injury. The benefits of a trial of labor after cesarean (TOLAC) resulting in a vaginal birth after cesarean (VBAC) include the following: shorter length of hospital stay and postpartum recovery (in most cases); fewer complications, such as postpartum fever, wound or uterine infection, thromboembolism (blood clots in the leg or lung), need for blood transfusion and fewer neonatal breathing problems.  The risks of an attempted VBAC or TOLAC include the following: Risk of failed trial of labor after cesarean (TOLAC) without a vaginal birth after cesarean (VBAC) resulting in repeat cesarean delivery (RCD) in about 20 to 40 percent of women who attempt VBAC.  Risk of rupture of uterus resulting in an emergency cesarean delivery. The risk of uterine rupture may be related in part to the type of uterine incision made during the first cesarean delivery. A previous transverse uterine incision has the lowest risk of rupture (0.2 to 1.5 percent risk). Vertical or T-shaped uterine incisions have a higher risk of uterine rupture (4 to 9 percent risk)The risk of fetal death is very low with both VBAC and elective repeat cesarean delivery (ERCD), but the likelihood of fetal death is higher with VBAC than  with ERCD. Maternal death is very rare with either type of delivery.  The risks of an elective repeat cesarean delivery (ERCD) were reviewed with the patient including but not limited to: 10/998 risk of uterine rupture  which could have serious consequences, bleeding which may require transfusion; infection which may require antibiotics; injury to bowel, bladder or other surrounding organs (bowel, bladder, ureters); injury to the fetus; need for additional procedures including hysterectomy in the event of a life-threatening hemorrhage; thromboembolic phenomenon; abnormal placentation; incisional problems; death and other postoperative or anesthesia complications.    Desires VBAC      Supervision of other normal pregnancy, antepartum 07/01/2021 by Tresea Mall, CNM No   Overview Addendum 10/27/2021  1:18 PM by Mirna Mires, CNM     Nursing Staff Provider  Office Location  [facility] Dating  By [place] at 19 wks  Language  English Anatomy US  complete  Flu Vaccine  08/08/2021 Genetic Screen  NIPS: declines  TDaP vaccine    Hgb A1C or  GTT Early : NA Third trimester : 73  Covid    LAB RESULTS   Rhogam   NA Blood Type A/Positive/-- (10/14 1438)   Feeding Plan Breast Antibody Negative (10/14 1438)  Contraception  Rubella 4.24 (10/14 1438)  Circumcision  RPR Non Reactive (10/14 1438)   Pediatrician   HBsAg Negative (10/14 1438)   Support Person Gerilyn Pilgrim HIV Non Reactive (10/14 1438)  Prenatal Classes  Varicella  non immune    GBS  (For PCN allergy, check sensitivities)   BTL Consent No    VBAC Consent Primary 2016 Pap  ASCUS/-HPV PAP March '22    Hgb Electro    Pelvis Tested  CF      SMA                   Preterm labor symptoms and general obstetric precautions including but not limited to vaginal bleeding, contractions, leaking of fluid and fetal movement were reviewed in detail with the patient. Please refer to After Visit Summary for other counseling recommendations.   Return in about 2 weeks (around 11/10/2021) for return OB, GBS, cultures.  Mirna Mires, CNM  10/27/2021 1:35 PM

## 2021-11-06 ENCOUNTER — Other Ambulatory Visit: Payer: Self-pay

## 2021-11-06 ENCOUNTER — Encounter: Payer: Self-pay | Admitting: Obstetrics and Gynecology

## 2021-11-06 ENCOUNTER — Observation Stay
Admission: EM | Admit: 2021-11-06 | Discharge: 2021-11-07 | Disposition: A | Discharge status: Home / Self Care | Payer: BC Managed Care – PPO | Attending: Licensed Practical Nurse | Admitting: Licensed Practical Nurse

## 2021-11-06 DIAGNOSIS — F17200 Nicotine dependence, unspecified, uncomplicated: Secondary | ICD-10-CM | POA: Insufficient documentation

## 2021-11-06 DIAGNOSIS — O0930 Supervision of pregnancy with insufficient antenatal care, unspecified trimester: Secondary | ICD-10-CM

## 2021-11-06 DIAGNOSIS — Z98891 History of uterine scar from previous surgery: Secondary | ICD-10-CM

## 2021-11-06 DIAGNOSIS — Z3A36 36 weeks gestation of pregnancy: Secondary | ICD-10-CM | POA: Insufficient documentation

## 2021-11-06 DIAGNOSIS — Z3480 Encounter for supervision of other normal pregnancy, unspecified trimester: Secondary | ICD-10-CM

## 2021-11-06 DIAGNOSIS — O479 False labor, unspecified: Secondary | ICD-10-CM

## 2021-11-06 DIAGNOSIS — O4703 False labor before 37 completed weeks of gestation, third trimester: Principal | ICD-10-CM | POA: Insufficient documentation

## 2021-11-06 DIAGNOSIS — O99333 Smoking (tobacco) complicating pregnancy, third trimester: Secondary | ICD-10-CM | POA: Insufficient documentation

## 2021-11-06 NOTE — OB Triage Note (Addendum)
Pt is a 20yo G2P1, 35w 6d. She arrived to the unit with complaints of ctx every starting at 1600 today. She stated that she has not taken any pain meds for the ctx. She denies vaginal bleeding and reports positive fetal movement. VS stable, monitors applied and assessing.   Initial FHT 125 at 2135.

## 2021-11-06 NOTE — H&P (Signed)
Obstetric H&P   Chief Complaint: uterine contractions since 1600  Prenatal Care Provider: [facility]  History of Present Illness: 21 y.o. G2P1001 [redacted]w[redacted]d by 12/05/2021, by Ultrasound presenting to L&D for uterine contractions since 1600.  Over the course of the day they hay increased in intensity and frequency, now they are every 8 mins. Also has had sharp pain in the groin and vagina.    Pregravid weight 71.7 kg Total Weight Gain 20.9 kg  pregnancy 2 Problems (from 07/01/21 to present)     Problem Noted Resolved   Uterine contractions 11/06/2021 by Ellwood Sayers, CNM No   Late prenatal care 07/11/2021 by Nadara Mustard, MD No   History of cesarean delivery 07/11/2021 by Nadara Mustard, MD No   Overview Signed 07/11/2021  2:43 PM by Nadara Mustard, MD    OB/GYN  Counseling Note  21 y.o. G2P1001 at [redacted]w[redacted]d with Estimated Date of Delivery: 12/05/21 was seen today in office to discuss trial of labor after cesarean section (TOLAC) versus elective repeat cesarean delivery (ERCD). The following risks were discussed with the patient.  Risk of uterine rupture at term is 0.78 percent with TOLAC and 0.22 percent with ERCD. 1 in 10 uterine ruptures will result in neonatal death or neurological injury. The benefits of a trial of labor after cesarean (TOLAC) resulting in a vaginal birth after cesarean (VBAC) include the following: shorter length of hospital stay and postpartum recovery (in most cases); fewer complications, such as postpartum fever, wound or uterine infection, thromboembolism (blood clots in the leg or lung), need for blood transfusion and fewer neonatal breathing problems.  The risks of an attempted VBAC or TOLAC include the following: Risk of failed trial of labor after cesarean (TOLAC) without a vaginal birth after cesarean (VBAC) resulting in repeat cesarean delivery (RCD) in about 20 to 40 percent of women who attempt VBAC.  Risk of rupture of uterus resulting in an emergency  cesarean delivery. The risk of uterine rupture may be related in part to the type of uterine incision made during the first cesarean delivery. A previous transverse uterine incision has the lowest risk of rupture (0.2 to 1.5 percent risk). Vertical or T-shaped uterine incisions have a higher risk of uterine rupture (4 to 9 percent risk)The risk of fetal death is very low with both VBAC and elective repeat cesarean delivery (ERCD), but the likelihood of fetal death is higher with VBAC than with ERCD. Maternal death is very rare with either type of delivery.  The risks of an elective repeat cesarean delivery (ERCD) were reviewed with the patient including but not limited to: 10/998 risk of uterine rupture which could have serious consequences, bleeding which may require transfusion; infection which may require antibiotics; injury to bowel, bladder or other surrounding organs (bowel, bladder, ureters); injury to the fetus; need for additional procedures including hysterectomy in the event of a life-threatening hemorrhage; thromboembolic phenomenon; abnormal placentation; incisional problems; death and other postoperative or anesthesia complications.    Desires VBAC      Supervision of other normal pregnancy, antepartum 07/01/2021 by Tresea Mall, CNM No   Overview Addendum 10/27/2021  1:18 PM by Mirna Mires, CNM     Nursing Staff Provider  Office Location  [facility] Dating  By [place] at 19 wks  Language  English Anatomy US  complete  Flu Vaccine  08/08/2021 Genetic Screen  NIPS: declines  TDaP vaccine    Hgb A1C or  GTT Early : NA Third trimester :  73  Covid    LAB RESULTS   Rhogam   NA Blood Type A/Positive/-- (10/14 1438)   Feeding Plan Breast Antibody Negative (10/14 1438)  Contraception  Rubella 4.24 (10/14 1438)  Circumcision  RPR Non Reactive (10/14 1438)   Pediatrician   HBsAg Negative (10/14 1438)   Support Person Gerilyn PilgrimJacob HIV Non Reactive (10/14 1438)  Prenatal Classes  Varicella  non  immune    GBS  (For PCN allergy, check sensitivities)   BTL Consent No    VBAC Consent Primary 2016 Pap  ASCUS/-HPV PAP March '22    Hgb Electro    Pelvis Tested  CF      SMA                   Review of Systems: 10 point review of systems negative unless otherwise noted in HPI  Past Medical History: Patient Active Problem List   Diagnosis Date Noted   Uterine contractions 11/06/2021   Late prenatal care 07/11/2021   History of cesarean delivery 07/11/2021    OB/GYN  Counseling Note  20 y.o. G2P1001 at 3974w0d with Estimated Date of Delivery: 12/05/21 was seen today in office to discuss trial of labor after cesarean section (TOLAC) versus elective repeat cesarean delivery (ERCD). The following risks were discussed with the patient.  Risk of uterine rupture at term is 0.78 percent with TOLAC and 0.22 percent with ERCD. 1 in 10 uterine ruptures will result in neonatal death or neurological injury. The benefits of a trial of labor after cesarean (TOLAC) resulting in a vaginal birth after cesarean (VBAC) include the following: shorter length of hospital stay and postpartum recovery (in most cases); fewer complications, such as postpartum fever, wound or uterine infection, thromboembolism (blood clots in the leg or lung), need for blood transfusion and fewer neonatal breathing problems.  The risks of an attempted VBAC or TOLAC include the following: Risk of failed trial of labor after cesarean (TOLAC) without a vaginal birth after cesarean (VBAC) resulting in repeat cesarean delivery (RCD) in about 20 to 40 percent of women who attempt VBAC.  Risk of rupture of uterus resulting in an emergency cesarean delivery. The risk of uterine rupture may be related in part to the type of uterine incision made during the first cesarean delivery. A previous transverse uterine incision has the lowest risk of rupture (0.2 to 1.5 percent risk). Vertical or T-shaped uterine incisions have a higher risk of  uterine rupture (4 to 9 percent risk)The risk of fetal death is very low with both VBAC and elective repeat cesarean delivery (ERCD), but the likelihood of fetal death is higher with VBAC than with ERCD. Maternal death is very rare with either type of delivery.  The risks of an elective repeat cesarean delivery (ERCD) were reviewed with the patient including but not limited to: 10/998 risk of uterine rupture which could have serious consequences, bleeding which may require transfusion; infection which may require antibiotics; injury to bowel, bladder or other surrounding organs (bowel, bladder, ureters); injury to the fetus; need for additional procedures including hysterectomy in the event of a life-threatening hemorrhage; thromboembolic phenomenon; abnormal placentation; incisional problems; death and other postoperative or anesthesia complications.    Desires VBAC    Supervision of other normal pregnancy, antepartum 07/01/2021     Nursing Staff Provider  Office Location  [facility] Dating  By US at 19 wks  Language  English Anatomy US  complete  Flu Vaccine  08/08/2021 Genetic Screen  NIPS:  declines  TDaP vaccine    Hgb A1C or  GTT Early : NA Third trimester : 73  Covid    LAB RESULTS   Rhogam   NA Blood Type A/Positive/-- (10/14 1438)   Feeding Plan Breast Antibody Negative (10/14 1438)  Contraception  Rubella 4.24 (10/14 1438)  Circumcision  RPR Non Reactive (10/14 1438)   Pediatrician   HBsAg Negative (10/14 1438)   Support Person Gerilyn Pilgrim HIV Non Reactive (10/14 1438)  Prenatal Classes  Varicella  non immune    GBS  (For PCN allergy, check sensitivities)   BTL Consent No    VBAC Consent Primary 2016 Pap  ASCUS/-HPV PAP March '22    Hgb Electro    Pelvis Tested  CF      SMA             Past Surgical History: Past Surgical History:  Procedure Laterality Date   CESAREAN SECTION N/A     Past Obstetric History: # 1 - Date: 2016, Sex: Female, Weight: None, GA: None, Delivery: None,  Apgar1: None, Apgar5: None, Living: None, Birth Comments: None  # 2 - Date: None, Sex: None, Weight: None, GA: None, Delivery: None, Apgar1: None, Apgar5: None, Living: None, Birth Comments: None   Past Gynecologic History:  Family History: History reviewed. No pertinent family history.  Social History: Social History   Socioeconomic History   Marital status: Married    Spouse name: Not on file   Number of children: Not on file   Years of education: Not on file   Highest education level: Not on file  Occupational History   Not on file  Tobacco Use   Smoking status: Some Days   Smokeless tobacco: Never  Vaping Use   Vaping Use: Every day  Substance and Sexual Activity   Alcohol use: No   Drug use: Never   Sexual activity: Yes    Birth control/protection: None  Other Topics Concern   Not on file  Social History Narrative   Not on file   Social Determinants of Health   Financial Resource Strain: Not on file  Food Insecurity: Not on file  Transportation Needs: Not on file  Physical Activity: Not on file  Stress: Not on file  Social Connections: Not on file  Intimate Partner Violence: Not on file    Medications: Prior to Admission medications   Medication Sig Start Date End Date Taking? Authorizing Provider  cephALEXin (KEFLEX) 500 MG capsule Take 1 capsule (500 mg total) by mouth 3 (three) times daily. Patient not taking: Reported on 10/13/2021 07/05/21   Tresea Mall, CNM  terconazole (TERAZOL 7) 0.4 % vaginal cream Place 1 applicator vaginally at bedtime. Patient not taking: Reported on 10/13/2021 08/08/21   Natale Milch, MD    Allergies: No Known Allergies  Physical Exam: Vitals: Blood pressure 132/75, pulse 100, temperature 98.2 F (36.8 C), temperature source Oral, resp. rate 16, height 5\' 8"  (1.727 m), weight 92.5 kg, last menstrual period 02/08/2021.    FHT: baseline 115, moderate variability, pos accel, neg decel  Toco: irregular, soft  resting tone   General: NAD HEENT: normocephalic, anicteric Pulmonary: No increased work of breathing Cardiovascular: RRR, distal pulses 2+ Abdomen: Gravid, non-tender Le[place]olds: Genitourinary: WNL Extremities: no edema, erythema, or tenderness Neurologic: Grossly intact Psychiatric: mood appropriate, affect full  Labs: No results found for this or any previous visit (from the past 24 hour(s)).  Assessment: 21 y.o. G2P1001 [redacted]w[redacted]d by 12/05/2021, by Ultrasound not in labor  Plan: 1) RNST    4) Immunization History -  Immunization History  Administered Date(s) Administered   Influenza,inj,Quad PF,6+ Mos 08/08/2021   Tdap 10/13/2021    5) Disposition - discharge home   Carie Caddy CNM  [facility], [facility] 11/06/2021, 10:09 PM

## 2021-11-07 ENCOUNTER — Telehealth: Payer: Self-pay

## 2021-11-07 DIAGNOSIS — Z3A36 36 weeks gestation of pregnancy: Secondary | ICD-10-CM | POA: Diagnosis not present

## 2021-11-07 DIAGNOSIS — F17200 Nicotine dependence, unspecified, uncomplicated: Secondary | ICD-10-CM | POA: Diagnosis not present

## 2021-11-07 DIAGNOSIS — O99333 Smoking (tobacco) complicating pregnancy, third trimester: Secondary | ICD-10-CM | POA: Diagnosis not present

## 2021-11-07 DIAGNOSIS — O4703 False labor before 37 completed weeks of gestation, third trimester: Secondary | ICD-10-CM | POA: Diagnosis not present

## 2021-11-07 NOTE — OB Triage Note (Signed)
Discharge instructions provided to pt. Pt verbalizes understanding. Vaginal bleeding and discharge, contractions, and fetal movement reviewed by RN. Follow-up care reviewed, pt will be seen in [facility] on 2/13. Pt discharged home with significant other.

## 2021-11-07 NOTE — Telephone Encounter (Signed)
Pt calling; 36wks; feeling a lot of cramping in lower stomach and low back pain; is tracking ctxs - they are not apart but they do last one minute. [phone]  Pt was seen in [facility] L&D.

## 2021-11-07 NOTE — Discharge Summary (Signed)
Physician Final Progress Note  Patient ID: Danielle Sheppard MRN: [id number] DOB/AGE: 01-27-2001 21 y.o.  Admit date: 11/06/2021 Admitting provider: Linzie Collin, MD Discharge date: 11/07/2021   Admission Diagnoses:  1) intrauterine pregnancy at [redacted]w[redacted]d  2) uterine contractions  Discharge Diagnoses:  Principal Problem:   Uterine contractions    History of Present Illness: The patient is a 21 y.o. female G2P1001 at [redacted]w[redacted]d who presents for for uterine contractions since 1600.  Over the course of the day they have increased in intensity and frequency, now they are every 8 mins. Also has had sharp pain in the groin and vagina.   Past Medical History:  Diagnosis Date   Anxiety    Depression     Past Surgical History:  Procedure Laterality Date   CESAREAN SECTION N/A     No current facility-administered medications on file prior to encounter.   Current Outpatient Medications on File Prior to Encounter  Medication Sig Dispense Refill   cephALEXin (KEFLEX) 500 MG capsule Take 1 capsule (500 mg total) by mouth 3 (three) times daily. (Patient not taking: Reported on 10/13/2021) 28 capsule 2   terconazole (TERAZOL 7) 0.4 % vaginal cream Place 1 applicator vaginally at bedtime. (Patient not taking: Reported on 10/13/2021) 45 g 0    No Known Allergies  Social History   Socioeconomic History   Marital status: Married    Spouse name: Not on file   Number of children: Not on file   Years of education: Not on file   Highest education level: Not on file  Occupational History   Not on file  Tobacco Use   Smoking status: Some Days   Smokeless tobacco: Never  Vaping Use   Vaping Use: Every day  Substance and Sexual Activity   Alcohol use: No   Drug use: Never   Sexual activity: Yes    Birth control/protection: None  Other Topics Concern   Not on file  Social History Narrative   Not on file   Social Determinants of Health   Financial Resource Strain: Not on file  Food  Insecurity: Not on file  Transportation Needs: Not on file  Physical Activity: Not on file  Stress: Not on file  Social Connections: Not on file  Intimate Partner Violence: Not on file    History reviewed. No pertinent family history.   Review of Systems  Constitutional: Negative.   Respiratory: Negative.    Cardiovascular: Negative.   Gastrointestinal: Negative.   Genitourinary: Negative.   Musculoskeletal:  Positive for back pain.       Groin pain   Neurological: Negative.   Psychiatric/Behavioral: Negative.      Physical Exam: BP 132/75 (BP Location: Right Arm)    Pulse 100    Temp 98.2 F (36.8 C) (Oral)    Resp 16    Ht 5\' 8"  (1.727 m)    Wt 92.5 kg    LMP 02/08/2021 (Within Days)    BMI 31.02 kg/m   Physical Exam Genitourinary:     Vulva normal.  Pulmonary:     Effort: Pulmonary effort is normal.  Abdominal:     Comments: Gravid, non-tender   Musculoskeletal:        General: Normal range of motion.  Neurological:     Mental Status: She is alert and oriented to person, place, and time.  Skin:    General: Skin is warm.  Psychiatric:        Mood and Affect: Mood normal.  SVE  1/50/-3  Consults: None  Significant Findings/ Diagnostic Studies: NA  Procedures: RNST  Hospital Course: The patient was admitted to Labor and Delivery Triage for observation. No change in cervical exam over three  hours, contractions spaced out, pt was able to nap while in observation.   Discharge Condition: good  Disposition: Discharge disposition: 01-Home or Self Care       Diet: Regular diet  Discharge Activity: Activity as tolerated   Allergies as of 11/07/2021   No Known Allergies      Medication List     STOP taking these medications    cephALEXin 500 MG capsule Commonly known as: KEFLEX   terconazole 0.4 % vaginal cream Commonly known as: TERAZOL 7         SignedEllouise Newer Adreena Willits, CNM  11/07/2021, 1:46 AM

## 2021-11-10 ENCOUNTER — Encounter: Payer: BC Managed Care – PPO | Admitting: Licensed Practical Nurse

## 2021-11-17 ENCOUNTER — Ambulatory Visit (INDEPENDENT_AMBULATORY_CARE_PROVIDER_SITE_OTHER): Payer: Medicaid Other | Admitting: Obstetrics

## 2021-11-17 ENCOUNTER — Other Ambulatory Visit: Payer: Self-pay

## 2021-11-17 VITALS — BP 122/70 | Wt 217.0 lb

## 2021-11-17 DIAGNOSIS — Z3A37 37 weeks gestation of pregnancy: Secondary | ICD-10-CM

## 2021-11-17 DIAGNOSIS — Z113 Encounter for screening for infections with a predominantly sexual mode of transmission: Secondary | ICD-10-CM

## 2021-11-17 DIAGNOSIS — Z3480 Encounter for supervision of other normal pregnancy, unspecified trimester: Secondary | ICD-10-CM

## 2021-11-17 NOTE — Progress Notes (Signed)
Routine Prenatal Care Visit  Subjective  Danielle Sheppard is a 21 y.o. G2P1001 at [redacted]w[redacted]d being seen today for ongoing prenatal care.  She is currently monitored for the following issues for this high-risk pregnancy and has Supervision of other normal pregnancy, antepartum; Late prenatal care; History of cesarean delivery; and Uterine contractions on their problem list.  ----------------------------------------------------------------------------------- Patient reports no complaints.   Contractions: Not present. Vag. Bleeding: None.  Movement: Present. Leaking Fluid denies.  ----------------------------------------------------------------------------------- The following portions of the patient's history were reviewed and updated as appropriate: allergies, current medications, past family history, past medical history, past social history, past surgical history and problem list. Problem list updated.  Objective  Blood pressure 122/70, weight 217 lb (98.4 kg), last menstrual period 02/08/2021. Pregravid weight 158 lb (71.7 kg) Total Weight Gain 59 lb (26.8 kg) Urinalysis: Urine Protein    Urine Glucose    Fetal Status:     Movement: Present     General:  Alert, oriented and cooperative. Patient is in no acute distress.  Skin: Skin is warm and dry. No rash noted.   Cardiovascular: Normal heart rate noted  Respiratory: Normal respiratory effort, no problems with respiration noted  Abdomen: Soft, gravid, appropriate for gestational age. Pain/Pressure: Absent     Pelvic:  Cervical exam deferred        Extremities: Normal range of motion.     Mental Status: Normal mood and affect. Normal behavior. Normal judgment and thought content.   Assessment   21 y.o. G2P1001 at [redacted]w[redacted]d by  12/05/2021, by Ultrasound presenting for routine prenatal visit  Plan   pregnancy 2 Problems (from 07/01/21 to present)    Problem Noted Resolved   Uterine contractions 11/06/2021 by Ellwood Sayers, CNM No   Late  prenatal care 07/11/2021 by Nadara Mustard, MD No   History of cesarean delivery 07/11/2021 by Nadara Mustard, MD No   Overview Signed 07/11/2021  2:43 PM by Nadara Mustard, MD    OB/GYN  Counseling Note  21 y.o. G2P1001 at [redacted]w[redacted]d with Estimated Date of Delivery: 12/05/21 was seen today in office to discuss trial of labor after cesarean section (TOLAC) versus elective repeat cesarean delivery (ERCD). The following risks were discussed with the patient.  Risk of uterine rupture at term is 0.78 percent with TOLAC and 0.22 percent with ERCD. 1 in 10 uterine ruptures will result in neonatal death or neurological injury. The benefits of a trial of labor after cesarean (TOLAC) resulting in a vaginal birth after cesarean (VBAC) include the following: shorter length of hospital stay and postpartum recovery (in most cases); fewer complications, such as postpartum fever, wound or uterine infection, thromboembolism (blood clots in the leg or lung), need for blood transfusion and fewer neonatal breathing problems.  The risks of an attempted VBAC or TOLAC include the following: Risk of failed trial of labor after cesarean (TOLAC) without a vaginal birth after cesarean (VBAC) resulting in repeat cesarean delivery (RCD) in about 20 to 40 percent of women who attempt VBAC.  Risk of rupture of uterus resulting in an emergency cesarean delivery. The risk of uterine rupture may be related in part to the type of uterine incision made during the first cesarean delivery. A previous transverse uterine incision has the lowest risk of rupture (0.2 to 1.5 percent risk). Vertical or T-shaped uterine incisions have a higher risk of uterine rupture (4 to 9 percent risk)The risk of fetal death is very low with both VBAC and elective repeat  cesarean delivery (ERCD), but the likelihood of fetal death is higher with VBAC than with ERCD. Maternal death is very rare with either type of delivery.  The risks of an elective repeat  cesarean delivery (ERCD) were reviewed with the patient including but not limited to: 10/998 risk of uterine rupture which could have serious consequences, bleeding which may require transfusion; infection which may require antibiotics; injury to bowel, bladder or other surrounding organs (bowel, bladder, ureters); injury to the fetus; need for additional procedures including hysterectomy in the event of a life-threatening hemorrhage; thromboembolic phenomenon; abnormal placentation; incisional problems; death and other postoperative or anesthesia complications.    Desires VBAC      Supervision of other normal pregnancy, antepartum 07/01/2021 by Tresea Mall, CNM No   Overview Addendum 10/27/2021  1:18 PM by Mirna Mires, CNM     Nursing Staff Provider  Office Location  [facility] Dating  By [place] at 19 wks  Language  English Anatomy US  complete  Flu Vaccine  08/08/2021 Genetic Screen  NIPS: declines  TDaP vaccine    Hgb A1C or  GTT Early : NA Third trimester : 73  Covid    LAB RESULTS   Rhogam   NA Blood Type A/Positive/-- (10/14 1438)   Feeding Plan Breast Antibody Negative (10/14 1438)  Contraception  Rubella 4.24 (10/14 1438)  Circumcision  RPR Non Reactive (10/14 1438)   Pediatrician   HBsAg Negative (10/14 1438)   Support Person Gerilyn Pilgrim HIV Non Reactive (10/14 1438)  Prenatal Classes  Varicella  non immune    GBS  (For PCN allergy, check sensitivities)   BTL Consent No    VBAC Consent Primary 2016 Pap  ASCUS/-HPV PAP March '22    Hgb Electro    Pelvis Tested  CF      SMA                   Term labor symptoms and general obstetric precautions including but not limited to vaginal bleeding, contractions, leaking of fluid and fetal movement were reviewed in detail with the patient. Please refer to After Visit Summary for other counseling recommendations.  Discussed VBAC today  No follow-ups on file.  Mirna Mires, CNM  11/17/2021 11:23 AM

## 2021-11-19 LAB — GC/CHLAMYDIA PROBE AMP
Chlamydia trachomatis, NAA: NEGATIVE
Neisseria Gonorrhoeae by PCR: NEGATIVE

## 2021-11-21 LAB — CULTURE, BETA STREP (GROUP B ONLY): Strep Gp B Culture: NEGATIVE

## 2021-11-22 ENCOUNTER — Observation Stay
Admission: EM | Admit: 2021-11-22 | Discharge: 2021-11-22 | Disposition: A | Discharge status: Home / Self Care | Payer: BC Managed Care – PPO | Attending: Obstetrics and Gynecology | Admitting: Obstetrics and Gynecology

## 2021-11-22 ENCOUNTER — Other Ambulatory Visit: Payer: Self-pay

## 2021-11-22 DIAGNOSIS — O471 False labor at or after 37 completed weeks of gestation: Secondary | ICD-10-CM | POA: Insufficient documentation

## 2021-11-22 DIAGNOSIS — F17200 Nicotine dependence, unspecified, uncomplicated: Secondary | ICD-10-CM | POA: Diagnosis not present

## 2021-11-22 DIAGNOSIS — O418X30 Other specified disorders of amniotic fluid and membranes, third trimester, not applicable or unspecified: Principal | ICD-10-CM | POA: Insufficient documentation

## 2021-11-22 DIAGNOSIS — Z98891 History of uterine scar from previous surgery: Secondary | ICD-10-CM

## 2021-11-22 DIAGNOSIS — O0930 Supervision of pregnancy with insufficient antenatal care, unspecified trimester: Secondary | ICD-10-CM

## 2021-11-22 DIAGNOSIS — O26893 Other specified pregnancy related conditions, third trimester: Secondary | ICD-10-CM

## 2021-11-22 DIAGNOSIS — Z3A38 38 weeks gestation of pregnancy: Secondary | ICD-10-CM

## 2021-11-22 DIAGNOSIS — O99333 Smoking (tobacco) complicating pregnancy, third trimester: Secondary | ICD-10-CM | POA: Diagnosis not present

## 2021-11-22 DIAGNOSIS — O34219 Maternal care for unspecified type scar from previous cesarean delivery: Secondary | ICD-10-CM

## 2021-11-22 DIAGNOSIS — O368130 Decreased fetal movements, third trimester, not applicable or unspecified: Secondary | ICD-10-CM | POA: Diagnosis not present

## 2021-11-22 DIAGNOSIS — O479 False labor, unspecified: Secondary | ICD-10-CM

## 2021-11-22 DIAGNOSIS — Z3480 Encounter for supervision of other normal pregnancy, unspecified trimester: Secondary | ICD-10-CM

## 2021-11-22 LAB — URINALYSIS, COMPLETE (UACMP) WITH MICROSCOPIC
Bilirubin Urine: NEGATIVE
Glucose, UA: NEGATIVE mg/dL
Ketones, ur: 20 mg/dL — AB
Leukocytes,Ua: NEGATIVE
Nitrite: NEGATIVE
Protein, ur: NEGATIVE mg/dL
Specific Gravity, Urine: 1.013 (ref 1.005–1.030)
pH: 7 (ref 5.0–8.0)

## 2021-11-22 LAB — RUPTURE OF MEMBRANE (ROM)PLUS: Rom Plus: NEGATIVE

## 2021-11-22 NOTE — OB Triage Note (Signed)
Patient is a  21 yo, G2P1, at 38 weeks 1 days. Patient presents with complaints of LOF and ctx approximately every 10 minutes. Patient states she felt a pop and gush of fluid at approximately 1630 today and has had irregular contractions rated 7/10 since then. Patient reports decreased FM today. Monitors applied and assessing. VSS. Initial fetal heart tone 125. Will continue to monitor.

## 2021-11-22 NOTE — Progress Notes (Signed)
Discharge instructions provided to patient. Patient verbalized understanding. Pt educated on signs and symptoms of labor, vaginal bleeding, LOF, and fetal movement. Red flag signs reviewed by RN. Patient discharged home with significant other in stable condition.  

## 2021-11-22 NOTE — Discharge Summary (Signed)
Physician Final Progress Note  Patient ID: Danielle Sheppard MRN: [id number] DOB/AGE: [birth year] 21 y.o.  Admit date: 11/22/2021 Admitting provider: Rod Can, CNM Discharge date: 11/22/2021   Admission Diagnoses:  1) intrauterine pregnancy at [redacted]w[redacted]d  2) fluid leaking  Discharge Diagnoses:  Principal Problem:   Labor and delivery, indication for care    History of Present Illness: The patient is a 21 y.o. female G2P1001 at 102w1d who presents for fluid leaking at around 4:30 PM today. She wore a pad that was damp. She reports irregular contractions as well this afternoon. She reports a decrease in fetal movement today. She denies vaginal bleeding. She has a history of c/section with G1 and desires TOLAC.    Past Medical History:  Diagnosis Date   Anxiety    Depression     Past Surgical History:  Procedure Laterality Date   CESAREAN SECTION N/A     No current facility-administered medications on file prior to encounter.   No current outpatient medications on file prior to encounter.    No Known Allergies  Social History   Socioeconomic History   Marital status: Married    Spouse name: Not on file   Number of children: Not on file   Years of education: Not on file   Highest education level: Not on file  Occupational History   Not on file  Tobacco Use   Smoking status: Some Days   Smokeless tobacco: Never  Vaping Use   Vaping Use: Every day  Substance and Sexual Activity   Alcohol use: No   Drug use: Never   Sexual activity: Yes    Birth control/protection: None  Other Topics Concern   Not on file  Social History Narrative   Not on file   Social Determinants of Health   Financial Resource Strain: Not on file  Food Insecurity: Not on file  Transportation Needs: Not on file  Physical Activity: Not on file  Stress: Not on file  Social Connections: Not on file  Intimate Partner Violence: Not on file    No family history on file.   Review of Systems   Constitutional:  Negative for chills and fever.  HENT:  Negative for congestion, ear discharge, ear pain, hearing loss, sinus pain and sore throat.   Eyes:  Negative for blurred vision and double vision.  Respiratory:  Negative for cough, shortness of breath and wheezing.   Cardiovascular:  Negative for chest pain, palpitations and leg swelling.  Gastrointestinal:  Positive for abdominal pain. Negative for blood in stool, constipation, diarrhea, heartburn, melena, nausea and vomiting.  Genitourinary:  Negative for dysuria, flank pain, frequency, hematuria and urgency.       Positive for fluid leaking  Musculoskeletal:  Negative for back pain, joint pain and myalgias.  Skin:  Negative for itching and rash.  Neurological:  Negative for dizziness, tingling, tremors, sensory change, speech change, focal weakness, seizures, loss of consciousness, weakness and headaches.  Endo/Heme/Allergies:  Negative for environmental allergies. Does not bruise/bleed easily.  Psychiatric/Behavioral:  Negative for depression, hallucinations, memory loss, substance abuse and suicidal ideas. The patient is not nervous/anxious and does not have insomnia.     Physical Exam: BP 131/66 (BP Location: Left Arm)    Pulse (!) 120    Temp 98.1 F (36.7 C) (Oral)    Resp 18    LMP 02/08/2021 (Within Days)   Constitutional: Well nourished, well developed female in no acute distress.  HEENT: normal Skin: Warm and dry.  Cardiovascular:  Regular rate and rhythm.   Respiratory: Clear to auscultation bilateral. Normal respiratory effort Abdomen: FHT present  Pelvic exam: per RN K. Greeson 1/70/ballotable- similar to exam on admission Toco: every 2-12 minutes  Fetal well being: 115 bpm, moderate variability, +accelerations, -decelerations  Consults: None  Significant Findings/ Diagnostic Studies: labs:   Latest Reference Range & Units 11/22/21 20:27 11/22/21 21:00  Appearance CLEAR   CLEAR !  Bilirubin Urine NEGATIVE    NEGATIVE  Color, Urine YELLOW   YELLOW !  Glucose, UA NEGATIVE mg/dL  NEGATIVE  Hgb urine dipstick NEGATIVE   SMALL !  Ketones, ur NEGATIVE mg/dL  20 !  Leukocytes,Ua NEGATIVE   NEGATIVE  Nitrite NEGATIVE   NEGATIVE  pH 5.0 - 8.0   7.0  Protein NEGATIVE mg/dL  NEGATIVE  Specific Gravity, Urine 1.005 - 1.030   1.013  Bacteria, UA NONE SEEN   RARE !  Mucus   PRESENT  RBC / HPF 0 - 5 RBC/hpf  11-20  Squamous Epithelial / LPF 0 - 5   0-5  WBC, UA 0 - 5 WBC/hpf  0-5  Rom Plus  NEGATIVE   !: Data is abnormal  Procedures: NST  Hospital Course: The patient was admitted to Labor and Delivery Triage for observation.   Discharge Condition: good  Disposition: Discharge disposition: 01-Home or Self Care  Diet: Regular diet  Discharge Activity: Activity as tolerated  Discharge Instructions     Discharge activity:  No Restrictions   Complete by: As directed    Discharge diet:  No restrictions   Complete by: As directed    LABOR:  When conractions begin, you should start to time them from the beginning of one contraction to the beginning  of the next.  When contractions are 5 - 10 minutes apart or less and have been regular for at least an hour, you should call your health care provider.   Complete by: As directed    No sexual activity restrictions   Complete by: As directed    Notify physician for bleeding from the vagina   Complete by: As directed    Notify physician for blurring of vision or spots before the eyes   Complete by: As directed    Notify physician for chills or fever   Complete by: As directed    Notify physician for fainting spells, "black outs" or loss of consciousness   Complete by: As directed    Notify physician for increase in vaginal discharge   Complete by: As directed    Notify physician for leaking of fluid   Complete by: As directed    Notify physician for pain or burning when urinating   Complete by: As directed    Notify physician for pelvic pressure  (sudden increase)   Complete by: As directed    Notify physician for severe or continued nausea or vomiting   Complete by: As directed    Notify physician for sudden gushing of fluid from the vagina (with or without continued leaking)   Complete by: As directed    Notify physician for sudden, constant, or occasional abdominal pain   Complete by: As directed    Notify physician if baby moving less than usual   Complete by: As directed       Allergies as of 11/22/2021   No Known Allergies      Medication List    You have not been prescribed any medications.     Follow-up Information  [facility]. Go to.   Specialty: Obstetrics and Gynecology Why: scheduled appointment Contact information: [address] [place] [id number] [phone]                Total time spent taking care of this patient: 18 minutes  Signed: Rod Can, CNM  11/22/2021, 10:29 PM

## 2021-11-26 ENCOUNTER — Encounter: Payer: Self-pay | Admitting: Licensed Practical Nurse

## 2021-11-26 ENCOUNTER — Ambulatory Visit (INDEPENDENT_AMBULATORY_CARE_PROVIDER_SITE_OTHER): Payer: BC Managed Care – PPO | Admitting: Licensed Practical Nurse

## 2021-11-26 ENCOUNTER — Other Ambulatory Visit: Payer: Self-pay

## 2021-11-26 VITALS — BP 124/70 | Ht 68.0 in

## 2021-11-26 DIAGNOSIS — O0990 Supervision of high risk pregnancy, unspecified, unspecified trimester: Secondary | ICD-10-CM

## 2021-11-26 DIAGNOSIS — Z3A38 38 weeks gestation of pregnancy: Secondary | ICD-10-CM

## 2021-11-26 DIAGNOSIS — Z3480 Encounter for supervision of other normal pregnancy, unspecified trimester: Secondary | ICD-10-CM

## 2021-11-26 NOTE — Progress Notes (Signed)
Routine Prenatal Care Visit ? ?Subjective  ?Danielle Sheppard is a 21 y.o. G2P1001 at [redacted]w[redacted]d being seen today for ongoing prenatal care.  She is currently monitored for the following issues for this high-risk pregnancy and has Supervision of other normal pregnancy, antepartum; Late prenatal care; History of cesarean delivery; Uterine contractions; Labor and delivery, indication for care; and [redacted] weeks gestation of pregnancy on their problem list.  ?----------------------------------------------------------------------------------- ?Patient reports no complaints.  Doing well.  Ready to meet this baby, reviewed ways to naturally encourage labor.   ?Contractions: Irritability. Vag. Bleeding: None.  Movement: Present. Leaking Fluid denies.  ?----------------------------------------------------------------------------------- ?The following portions of the patient's history were reviewed and updated as appropriate: allergies, current medications, past family history, past medical history, past social history, past surgical history and problem list. Problem list updated. ? ?Objective  ?Blood pressure 124/70, height 5\' 8"  (1.727 m), last menstrual period 02/08/2021. ?Pregravid weight 158 lb (71.7 kg) Total Weight Gain 59 lb (26.8 kg) ?Urinalysis: Urine Protein    Urine Glucose   ? ?Fetal Status: Fetal Heart Rate (bpm): 130 Fundal Height: 39 cm Movement: Present    ? ?General:  Alert, oriented and cooperative. Patient is in no acute distress.  ?Skin: Skin is warm and dry. No rash noted.   ?Cardiovascular: Normal heart rate noted  ?Respiratory: Normal respiratory effort, no problems with respiration noted  ?Abdomen: Soft, gravid, appropriate for gestational age. Pain/Pressure: Absent     ?Pelvic:  Cervical exam performed Dilation: 1 Effacement (%): 70 Station: -3  ?Extremities: Normal range of motion.  Edema: Trace  ?Mental Status: Normal mood and affect. Normal behavior. Normal judgment and thought content.  ? ?Assessment   ? ?21 y.o. G2P1001 at [redacted]w[redacted]d by  12/05/2021, by Ultrasound presenting for routine prenatal visit ? ?Plan  ? ?pregnancy 2 Problems (from 07/01/21 to present)   ? ? Problem Noted Resolved  ? Uterine contractions 11/06/2021 by Allen Derry, CNM No  ? Late prenatal care 07/11/2021 by Gae Dry, MD No  ? History of cesarean delivery 07/11/2021 by Gae Dry, MD No  ? Overview Signed 07/11/2021  2:43 PM by Gae Dry, MD  ?  OB/GYN  Counseling Note ? ?21 y.o. G2P1001 at [redacted]w[redacted]d with Estimated Date of Delivery: 12/05/21 was seen today in office to discuss trial of labor after cesarean section (TOLAC) versus elective repeat cesarean delivery (ERCD). The following risks were discussed with the patient. ? ?Risk of uterine rupture at term is 0.78 percent with TOLAC and 0.22 percent with ERCD. 1 in 10 uterine ruptures will result in neonatal death or neurological injury. ?The benefits of a trial of labor after cesarean (TOLAC) resulting in a vaginal birth after cesarean (VBAC) include the following: shorter length of hospital stay and postpartum recovery (in most cases); fewer complications, such as postpartum fever, wound or uterine infection, thromboembolism (blood clots in the leg or lung), need for blood transfusion and fewer neonatal breathing problems. ? ?The risks of an attempted VBAC or TOLAC include the following: ?Risk of failed trial of labor after cesarean (TOLAC) without a vaginal birth after cesarean (VBAC) resulting in repeat cesarean delivery (RCD) in about 20 to 41 percent of women who attempt VBAC.  ?Risk of rupture of uterus resulting in an emergency cesarean delivery. The risk of uterine rupture may be related in part to the type of uterine incision made during the first cesarean delivery. A previous transverse uterine incision has the lowest risk of rupture (0.2 to 1.5  percent risk). Vertical or T-shaped uterine incisions have a higher risk of uterine rupture (4 to 9 percent risk)The  risk of fetal death is very low with both VBAC and elective repeat cesarean delivery (ERCD), but the likelihood of fetal death is higher with VBAC than with ERCD. Maternal death is very rare with either type of delivery. ? ?The risks of an elective repeat cesarean delivery (ERCD) were reviewed with the patient including but not limited to: 10/998 risk of uterine rupture which could have serious consequences, bleeding which may require transfusion; infection which may require antibiotics; injury to bowel, bladder or other surrounding organs (bowel, bladder, ureters); injury to the fetus; need for additional procedures including hysterectomy in the event of a life-threatening hemorrhage; thromboembolic phenomenon; abnormal placentation; incisional problems; death and other postoperative or anesthesia complications.   ? ?Desires VBAC ?  ?  ? Supervision of other normal pregnancy, antepartum 07/01/2021 by Rod Can, CNM No  ? Overview Addendum 11/24/2021  5:07 PM by Imagene Riches, CNM  ?   ?Nursing Staff Provider  ?Office Location  [facility] Dating  By [place] at 19 wks  ?Language  English Anatomy US  complete  ?Flu Vaccine  08/08/2021 Genetic Screen  NIPS: declines  ?TDaP vaccine    Hgb A1C or  ?GTT Early : NA ?Third trimester : 75  ?Covid    LAB RESULTS   ?Rhogam   NA Blood Type     ?Feeding Plan Breast Antibody    ?Contraception  Rubella    ?Circumcision  RPR     ?Pediatrician   HBsAg     ?Support Person Danielle Sheppard HIV    ?Prenatal Classes  Varicella  non immune  ?  GBS  (For PCN allergy, check sensitivities) negative  ?BTL Consent No    ?VBAC Consent Primary 2016 Pap  ASCUS/-HPV PAP March '22  ?  Hgb Electro    ?Pelvis Tested  CF   ?   SMA   ?     ? ? ?  ?  ? ?  ?  ? ?Term labor symptoms and general obstetric precautions including but not limited to vaginal bleeding, contractions, leaking of fluid and fetal movement were reviewed in detail with the patient. ?Please refer to After Visit Summary for other counseling  recommendations.  ? ?Return in about 1 week (around 12/03/2021) for [place]., consider sweep next visit  ? ?Roberto Scales, CNM  ?Mosetta Pigeon, [employer]  ?11/26/21  ?4:47 PM  ? ? ?

## 2021-12-03 ENCOUNTER — Ambulatory Visit (INDEPENDENT_AMBULATORY_CARE_PROVIDER_SITE_OTHER): Payer: BC Managed Care – PPO | Admitting: Obstetrics

## 2021-12-03 ENCOUNTER — Other Ambulatory Visit: Payer: Self-pay

## 2021-12-03 VITALS — BP 122/74 | Wt 221.0 lb

## 2021-12-03 DIAGNOSIS — Z3A39 39 weeks gestation of pregnancy: Secondary | ICD-10-CM

## 2021-12-03 DIAGNOSIS — O0990 Supervision of high risk pregnancy, unspecified, unspecified trimester: Secondary | ICD-10-CM

## 2021-12-03 NOTE — Progress Notes (Signed)
Routine Prenatal Care Visit ? ?Subjective  ?Danielle Sheppard is a 21 y.o. G2P1001 at [redacted]w[redacted]d being seen today for ongoing prenatal care.  She is currently monitored for the following issues for this high-risk pregnancy and has Supervision of other normal pregnancy, antepartum; Late prenatal care; History of cesarean delivery; and Uterine contractions on their problem list.  ?----------------------------------------------------------------------------------- ?Patient reports no bleeding, no contractions and no cramping.  She very much desires a VBAC, and requests a sweep today. Some expressed anxiety about SVEs and labor. Epidural planned ?Contractions: Irritability. Vag. Bleeding: None.  Movement: Present. Leaking Fluid denies.  ?----------------------------------------------------------------------------------- ?The following portions of the patient's history were reviewed and updated as appropriate: allergies, current medications, past family history, past medical history, past social history, past surgical history and problem list. Problem list updated. ? ?Objective  ?Blood pressure 122/74, weight 221 lb (100.2 kg), last menstrual period 02/08/2021. ?Pregravid weight 158 lb (71.7 kg) Total Weight Gain 63 lb (28.6 kg) ?Urinalysis: Urine Protein    Urine Glucose   ? ?Fetal Status:     Movement: Present    ? ?General:  Alert, oriented and cooperative. Patient is in no acute distress.  ?Skin: Skin is warm and dry. No rash noted.   ?Cardiovascular: Normal heart rate noted  ?Respiratory: Normal respiratory effort, no problems with respiration noted  ?Abdomen: Soft, gravid, appropriate for gestational age. Pain/Pressure: Present     ?Pelvic:  3 cms/80%/-3. cervix is anterior and medium softness        ?Extremities: Normal range of motion.     ?Mental Status: Normal mood and affect. Normal behavior. Normal judgment and thought content.  ? ?Assessment  ? ?21 y.o. G2P1001 at [redacted]w[redacted]d by  12/05/2021, by Ultrasound presenting for  routine prenatal visit ? ?Plan  ? ?pregnancy 2 Problems (from 07/01/21 to present)   ? Problem Noted Resolved  ? Uterine contractions 11/06/2021 by Allen Derry, CNM No  ? Late prenatal care 07/11/2021 by Gae Dry, MD No  ? History of cesarean delivery 07/11/2021 by Gae Dry, MD No  ? Overview Signed 07/11/2021  2:43 PM by Gae Dry, MD  ?  OB/GYN  Counseling Note ? ?21 y.o. G2P1001 at [redacted]w[redacted]d with Estimated Date of Delivery: 12/05/21 was seen today in office to discuss trial of labor after cesarean section (TOLAC) versus elective repeat cesarean delivery (ERCD). The following risks were discussed with the patient. ? ?Risk of uterine rupture at term is 0.78 percent with TOLAC and 0.22 percent with ERCD. 1 in 10 uterine ruptures will result in neonatal death or neurological injury. ?The benefits of a trial of labor after cesarean (TOLAC) resulting in a vaginal birth after cesarean (VBAC) include the following: shorter length of hospital stay and postpartum recovery (in most cases); fewer complications, such as postpartum fever, wound or uterine infection, thromboembolism (blood clots in the leg or lung), need for blood transfusion and fewer neonatal breathing problems. ? ?The risks of an attempted VBAC or TOLAC include the following: ?Risk of failed trial of labor after cesarean (TOLAC) without a vaginal birth after cesarean (VBAC) resulting in repeat cesarean delivery (RCD) in about 20 to 62 percent of women who attempt VBAC.  ?Risk of rupture of uterus resulting in an emergency cesarean delivery. The risk of uterine rupture may be related in part to the type of uterine incision made during the first cesarean delivery. A previous transverse uterine incision has the lowest risk of rupture (0.2 to 1.5 percent risk). Vertical or  T-shaped uterine incisions have a higher risk of uterine rupture (4 to 9 percent risk)The risk of fetal death is very low with both VBAC and elective repeat cesarean  delivery (ERCD), but the likelihood of fetal death is higher with VBAC than with ERCD. Maternal death is very rare with either type of delivery. ? ?The risks of an elective repeat cesarean delivery (ERCD) were reviewed with the patient including but not limited to: 10/998 risk of uterine rupture which could have serious consequences, bleeding which may require transfusion; infection which may require antibiotics; injury to bowel, bladder or other surrounding organs (bowel, bladder, ureters); injury to the fetus; need for additional procedures including hysterectomy in the event of a life-threatening hemorrhage; thromboembolic phenomenon; abnormal placentation; incisional problems; death and other postoperative or anesthesia complications.   ? ?Desires VBAC ?  ?  ? Supervision of other normal pregnancy, antepartum 07/01/2021 by Rod Can, CNM No  ? Overview Addendum 11/26/2021  4:47 PM by Allen Derry, CNM  ?   ?Nursing Staff Provider  ?Office Location  [facility] Dating  By [place] at 19 wks  ?Language  English Anatomy US  complete  ?Flu Vaccine  08/08/2021 Genetic Screen  NIPS: declines  ?TDaP vaccine   10/12/21 Hgb A1C or  ?GTT Early : NA ?Third trimester : 50  ?Covid    LAB RESULTS   ?Rhogam   NA Blood Type A/Positive/-- (10/14 1438)   ?Feeding Plan Breast Antibody Negative (10/14 1438)  ?Contraception  Rubella 4.24 (10/14 1438)  ?Circumcision  RPR Non Reactive (12/09 [id number])   ?Pediatrician   HBsAg Negative (10/14 1438)   ?Support Person Edison Nasuti HIV Non Reactive (12/09 [id number])  ?Prenatal Classes  Varicella  non immune  ?  GBS Negative/-- (02/20 0440)(For PCN allergy, check sensitivities) negative  ?BTL Consent No    ?VBAC Consent Primary 2016 Pap  ASCUS/-HPV PAP March '22  ?  Hgb Electro    ?Pelvis Tested  CF   ?   SMA   ?     ? ? ?  ?  ?  ?  ? ?Term labor symptoms and general obstetric precautions including but not limited to vaginal bleeding, contractions, leaking of fluid and fetal movement were reviewed in  detail with the patient. ?Please refer to After Visit Summary for other counseling recommendations.  ?She did well with the SVE today, although she admits to anxiety re exams. Plans epidural. Will need pap smear PP. ? ?Return in about 5 days (around 12/08/2021) for return OB, cervical sweep. ? ?Imagene Riches, CNM  ?12/03/2021 11:44 AM   ? ?

## 2021-12-03 NOTE — Progress Notes (Signed)
No vb. No lof. Cervical check  

## 2021-12-06 ENCOUNTER — Inpatient Hospital Stay
Admission: EM | Admit: 2021-12-06 | Discharge: 2021-12-07 | DRG: 806 | Disposition: A | Discharge status: Home / Self Care | Payer: BC Managed Care – PPO | Attending: Obstetrics and Gynecology | Admitting: Obstetrics and Gynecology

## 2021-12-06 ENCOUNTER — Inpatient Hospital Stay: Payer: BC Managed Care – PPO | Admitting: Anesthesiology

## 2021-12-06 ENCOUNTER — Encounter: Payer: Self-pay | Admitting: Obstetrics & Gynecology

## 2021-12-06 ENCOUNTER — Other Ambulatory Visit: Payer: Self-pay

## 2021-12-06 DIAGNOSIS — O34219 Maternal care for unspecified type scar from previous cesarean delivery: Secondary | ICD-10-CM | POA: Diagnosis present

## 2021-12-06 DIAGNOSIS — D62 Acute posthemorrhagic anemia: Secondary | ICD-10-CM | POA: Diagnosis not present

## 2021-12-06 DIAGNOSIS — O99334 Smoking (tobacco) complicating childbirth: Secondary | ICD-10-CM | POA: Diagnosis present

## 2021-12-06 DIAGNOSIS — O480 Post-term pregnancy: Secondary | ICD-10-CM | POA: Diagnosis present

## 2021-12-06 DIAGNOSIS — F17200 Nicotine dependence, unspecified, uncomplicated: Secondary | ICD-10-CM | POA: Diagnosis present

## 2021-12-06 DIAGNOSIS — O9081 Anemia of the puerperium: Secondary | ICD-10-CM | POA: Diagnosis not present

## 2021-12-06 DIAGNOSIS — Z20822 Contact with and (suspected) exposure to covid-19: Secondary | ICD-10-CM | POA: Diagnosis present

## 2021-12-06 DIAGNOSIS — Z98891 History of uterine scar from previous surgery: Principal | ICD-10-CM

## 2021-12-06 DIAGNOSIS — Z3A40 40 weeks gestation of pregnancy: Secondary | ICD-10-CM

## 2021-12-06 DIAGNOSIS — O0930 Supervision of pregnancy with insufficient antenatal care, unspecified trimester: Secondary | ICD-10-CM

## 2021-12-06 DIAGNOSIS — O26893 Other specified pregnancy related conditions, third trimester: Secondary | ICD-10-CM | POA: Diagnosis present

## 2021-12-06 DIAGNOSIS — O479 False labor, unspecified: Secondary | ICD-10-CM | POA: Diagnosis present

## 2021-12-06 DIAGNOSIS — Z3480 Encounter for supervision of other normal pregnancy, unspecified trimester: Secondary | ICD-10-CM

## 2021-12-06 LAB — CBC
HCT: 37.1 % (ref 36.0–46.0)
Hemoglobin: 12.3 g/dL (ref 12.0–15.0)
MCH: 29 pg (ref 26.0–34.0)
MCHC: 33.2 g/dL (ref 30.0–36.0)
MCV: 87.5 fL (ref 80.0–100.0)
Platelets: 311 10*3/uL (ref 150–400)
RBC: 4.24 MIL/uL (ref 3.87–5.11)
RDW: 13.8 % (ref 11.5–15.5)
WBC: 18.4 10*3/uL — ABNORMAL HIGH (ref 4.0–10.5)
nRBC: 0 % (ref 0.0–0.2)

## 2021-12-06 LAB — RAPID HIV SCREEN (HIV 1/2 AB+AG)
HIV 1/2 Antibodies: NONREACTIVE
HIV-1 P24 Antigen - HIV24: NONREACTIVE

## 2021-12-06 LAB — TYPE AND SCREEN
ABO/RH(D): A POS
Antibody Screen: NEGATIVE

## 2021-12-06 LAB — ABO/RH: ABO/RH(D): A POS

## 2021-12-06 LAB — RESP PANEL BY RT-PCR (FLU A&B, COVID) ARPGX2
Influenza A by PCR: NEGATIVE
Influenza B by PCR: NEGATIVE
SARS Coronavirus 2 by RT PCR: NEGATIVE

## 2021-12-06 MED ORDER — LIDOCAINE HCL (PF) 1 % IJ SOLN
INTRAMUSCULAR | Status: AC
Start: 2021-12-06 — End: 2021-12-06
  Administered 2021-12-06: 30 mL via SUBCUTANEOUS
  Filled 2021-12-06: qty 30

## 2021-12-06 MED ORDER — FENTANYL-BUPIVACAINE-NACL 0.5-0.125-0.9 MG/250ML-% EP SOLN
EPIDURAL | Status: AC
  Filled 2021-12-06: qty 250

## 2021-12-06 MED ORDER — BUTORPHANOL TARTRATE 1 MG/ML IJ SOLN: 2.0000 mg | INTRAMUSCULAR | Status: DC | PRN

## 2021-12-06 MED ORDER — CARBOPROST TROMETHAMINE 250 MCG/ML IM SOLN
250.0000 ug | Freq: Once | INTRAMUSCULAR | Status: AC
  Administered 2021-12-06: 250 ug via INTRAMUSCULAR

## 2021-12-06 MED ORDER — LIDOCAINE HCL (PF) 1 % IJ SOLN
INTRAMUSCULAR | Status: DC | PRN
  Administered 2021-12-06: 3 mL via SUBCUTANEOUS

## 2021-12-06 MED ORDER — AMMONIA AROMATIC IN INHA
RESPIRATORY_TRACT | Status: AC
  Filled 2021-12-06: qty 10

## 2021-12-06 MED ORDER — DIBUCAINE (PERIANAL) 1 % EX OINT
1.0000 "application " | TOPICAL_OINTMENT | CUTANEOUS | Status: DC | PRN
  Administered 2021-12-06: 1 via RECTAL
  Filled 2021-12-06: qty 28

## 2021-12-06 MED ORDER — ONDANSETRON HCL 4 MG/2ML IJ SOLN
4.0000 mg | Freq: Four times a day (QID) | INTRAMUSCULAR | Status: DC | PRN
  Administered 2021-12-06: 4 mg via INTRAVENOUS
  Filled 2021-12-06: qty 2

## 2021-12-06 MED ORDER — WITCH HAZEL-GLYCERIN EX PADS
1.0000 "application " | MEDICATED_PAD | CUTANEOUS | Status: DC | PRN
  Administered 2021-12-06: 1 via TOPICAL
  Filled 2021-12-06: qty 100

## 2021-12-06 MED ORDER — BENZOCAINE-MENTHOL 20-0.5 % EX AERO
1.0000 "application " | INHALATION_SPRAY | CUTANEOUS | Status: DC | PRN
  Administered 2021-12-06: 1 via TOPICAL
  Filled 2021-12-06: qty 56

## 2021-12-06 MED ORDER — DOCUSATE SODIUM 100 MG PO CAPS
100.0000 mg | ORAL_CAPSULE | Freq: Two times a day (BID) | ORAL | Status: DC
  Administered 2021-12-06 – 2021-12-07 (×2): 100 mg via ORAL
  Filled 2021-12-06 (×2): qty 1

## 2021-12-06 MED ORDER — VARICELLA VIRUS VACCINE LIVE 1350 PFU/0.5ML IJ SUSR
0.5000 mL | Freq: Once | INTRAMUSCULAR | Status: DC
  Filled 2021-12-06: qty 0.5

## 2021-12-06 MED ORDER — ACETAMINOPHEN 500 MG PO TABS
1000.0000 mg | ORAL_TABLET | Freq: Four times a day (QID) | ORAL | Status: DC
  Administered 2021-12-06 – 2021-12-07 (×3): 1000 mg via ORAL
  Filled 2021-12-06 (×4): qty 2

## 2021-12-06 MED ORDER — COCONUT OIL OIL
1.0000 "application " | TOPICAL_OIL | Status: DC | PRN
  Administered 2021-12-06: 1 via TOPICAL
  Filled 2021-12-06: qty 120

## 2021-12-06 MED ORDER — FENTANYL-BUPIVACAINE-NACL 0.5-0.125-0.9 MG/250ML-% EP SOLN
EPIDURAL | Status: DC | PRN
  Administered 2021-12-06: 12 mL/h via EPIDURAL

## 2021-12-06 MED ORDER — PRENATAL MULTIVITAMIN CH
1.0000 | ORAL_TABLET | Freq: Every day | ORAL | Status: DC
  Administered 2021-12-07: 1 via ORAL
  Filled 2021-12-06: qty 1

## 2021-12-06 MED ORDER — OXYTOCIN-SODIUM CHLORIDE 30-0.9 UT/500ML-% IV SOLN
2.5000 [IU]/h | INTRAVENOUS | Status: DC
  Filled 2021-12-06: qty 500

## 2021-12-06 MED ORDER — OXYTOCIN BOLUS FROM INFUSION
333.0000 mL | Freq: Once | INTRAVENOUS | Status: AC
  Administered 2021-12-06: 333 mL via INTRAVENOUS

## 2021-12-06 MED ORDER — ONDANSETRON HCL 4 MG/2ML IJ SOLN: 4.0000 mg | INTRAMUSCULAR | Status: DC | PRN

## 2021-12-06 MED ORDER — SOD CITRATE-CITRIC ACID 500-334 MG/5ML PO SOLN: 30.0000 mL | ORAL | Status: DC | PRN

## 2021-12-06 MED ORDER — ZOLPIDEM TARTRATE 5 MG PO TABS: 5.0000 mg | ORAL_TABLET | Freq: Every evening | ORAL | Status: DC | PRN

## 2021-12-06 MED ORDER — LIDOCAINE HCL (PF) 1 % IJ SOLN: 30.0000 mL | INTRAMUSCULAR | Status: AC | PRN

## 2021-12-06 MED ORDER — METHYLERGONOVINE MALEATE 0.2 MG/ML IJ SOLN
0.2000 mg | Freq: Once | INTRAMUSCULAR | Status: AC
  Administered 2021-12-06: 0.2 mg via INTRAMUSCULAR

## 2021-12-06 MED ORDER — OXYTOCIN 10 UNIT/ML IJ SOLN
INTRAMUSCULAR | Status: AC
  Filled 2021-12-06: qty 2

## 2021-12-06 MED ORDER — LIDOCAINE-EPINEPHRINE (PF) 1.5 %-1:200000 IJ SOLN
INTRAMUSCULAR | Status: DC | PRN
  Administered 2021-12-06: 3 mL via EPIDURAL

## 2021-12-06 MED ORDER — SIMETHICONE 80 MG PO CHEW: 80.0000 mg | CHEWABLE_TABLET | ORAL | Status: DC | PRN

## 2021-12-06 MED ORDER — DIPHENOXYLATE-ATROPINE 2.5-0.025 MG PO TABS
ORAL_TABLET | ORAL | Status: AC
  Filled 2021-12-06: qty 2

## 2021-12-06 MED ORDER — DIPHENOXYLATE-ATROPINE 2.5-0.025 MG PO TABS
2.0000 | ORAL_TABLET | Freq: Once | ORAL | Status: AC
  Administered 2021-12-06: 2 via ORAL
  Filled 2021-12-06: qty 2

## 2021-12-06 MED ORDER — LACTATED RINGERS IV SOLN
500.0000 mL | INTRAVENOUS | Status: DC | PRN
  Administered 2021-12-06: 1000 mL via INTRAVENOUS

## 2021-12-06 MED ORDER — BUPIVACAINE HCL (PF) 0.25 % IJ SOLN
INTRAMUSCULAR | Status: DC | PRN
  Administered 2021-12-06 (×2): 5 mL via EPIDURAL

## 2021-12-06 MED ORDER — LACTATED RINGERS IV SOLN: INTRAVENOUS | Status: DC

## 2021-12-06 MED ORDER — IBUPROFEN 600 MG PO TABS
600.0000 mg | ORAL_TABLET | Freq: Four times a day (QID) | ORAL | Status: DC
  Administered 2021-12-06 – 2021-12-07 (×4): 600 mg via ORAL
  Filled 2021-12-06 (×4): qty 1

## 2021-12-06 MED ORDER — ACETAMINOPHEN 325 MG PO TABS
650.0000 mg | ORAL_TABLET | ORAL | Status: DC | PRN
  Administered 2021-12-06: 650 mg via ORAL
  Filled 2021-12-06: qty 2

## 2021-12-06 MED ORDER — ONDANSETRON HCL 4 MG PO TABS: 4.0000 mg | ORAL_TABLET | ORAL | Status: DC | PRN

## 2021-12-06 MED ORDER — MISOPROSTOL 200 MCG PO TABS
ORAL_TABLET | ORAL | Status: AC
  Administered 2021-12-06: 800 ug
  Filled 2021-12-06: qty 4

## 2021-12-06 MED ORDER — DIPHENHYDRAMINE HCL 25 MG PO CAPS
25.0000 mg | ORAL_CAPSULE | Freq: Four times a day (QID) | ORAL | Status: DC | PRN
  Administered 2021-12-06: 25 mg via ORAL
  Filled 2021-12-06: qty 1

## 2021-12-06 NOTE — OB Triage Note (Signed)
Pt is a 20y/o G2P1 at [redacted]w[redacted]d with c/o contractrations rating 8/10 on the pain scale. Pt states +FM. Pt denies LOF and VB. Monitors applied and assessing. Initial FHT 125.  ?

## 2021-12-06 NOTE — H&P (Signed)
Danielle MerinoGabrielle Geiler is an 21 y.o. female.   ?Chief Complaint: Uterine contractions ? ?HPI: Patient presents to L&D with complaints of uterine contractions. She has a history of cesarean section but desires TOLAC.  ? ?pregnancy 2 Problems (from 07/01/21 to present)   ? ? Problem Noted Resolved  ? Uterine contractions 11/06/2021 by Ellwood Sayersominic, Lydia Marie, CNM No  ? Late prenatal care 07/11/2021 by Nadara MustardHarris, Robert P, MD No  ? History of cesarean delivery 07/11/2021 by Nadara MustardHarris, Robert P, MD No  ? Overview Signed 07/11/2021  2:43 PM by Nadara MustardHarris, Robert P, MD  ?  OB/GYN  Counseling Note ? ?21 y.o. G2P1001 at 3212w0d with Estimated Date of Delivery: 12/05/21 was seen today in office to discuss trial of labor after cesarean section (TOLAC) versus elective repeat cesarean delivery (ERCD). The following risks were discussed with the patient. ? ?Risk of uterine rupture at term is 0.78 percent with TOLAC and 0.22 percent with ERCD. 1 in 10 uterine ruptures will result in neonatal death or neurological injury. ?The benefits of a trial of labor after cesarean (TOLAC) resulting in a vaginal birth after cesarean (VBAC) include the following: shorter length of hospital stay and postpartum recovery (in most cases); fewer complications, such as postpartum fever, wound or uterine infection, thromboembolism (blood clots in the leg or lung), need for blood transfusion and fewer neonatal breathing problems. ? ?The risks of an attempted VBAC or TOLAC include the following: ?Risk of failed trial of labor after cesarean (TOLAC) without a vaginal birth after cesarean (VBAC) resulting in repeat cesarean delivery (RCD) in about 20 to 40 percent of women who attempt VBAC.  ?Risk of rupture of uterus resulting in an emergency cesarean delivery. The risk of uterine rupture may be related in part to the type of uterine incision made during the first cesarean delivery. A previous transverse uterine incision has the lowest risk of rupture (0.2 to 1.5 percent  risk). Vertical or T-shaped uterine incisions have a higher risk of uterine rupture (4 to 9 percent risk)The risk of fetal death is very low with both VBAC and elective repeat cesarean delivery (ERCD), but the likelihood of fetal death is higher with VBAC than with ERCD. Maternal death is very rare with either type of delivery. ? ?The risks of an elective repeat cesarean delivery (ERCD) were reviewed with the patient including but not limited to: 10/998 risk of uterine rupture which could have serious consequences, bleeding which may require transfusion; infection which may require antibiotics; injury to bowel, bladder or other surrounding organs (bowel, bladder, ureters); injury to the fetus; need for additional procedures including hysterectomy in the event of a life-threatening hemorrhage; thromboembolic phenomenon; abnormal placentation; incisional problems; death and other postoperative or anesthesia complications.   ? ?Desires VBAC ?  ?  ? Supervision of other normal pregnancy, antepartum 07/01/2021 by Tresea MallGledhill, Jane, CNM No  ? Overview Addendum 11/26/2021  4:47 PM by Ellwood Sayersominic, Lydia Marie, CNM  ?   ?Nursing Staff Provider  ?Office Location  [facility] Dating  By US at 19 wks  ?Language  English Anatomy US  complete  ?Flu Vaccine  08/08/2021 Genetic Screen  NIPS: declines  ?TDaP vaccine   10/12/21 Hgb A1C or  ?GTT Early : NA ?Third trimester : 6873  ?Covid    LAB RESULTS   ?Rhogam   NA Blood Type A/Positive/-- (10/14 1438)   ?Feeding Plan Breast Antibody Negative (10/14 1438)  ?Contraception  Rubella 4.24 (10/14 1438)  ?Circumcision  RPR Non Reactive (12/09 [id number])   ?  Pediatrician   HBsAg Negative (10/14 1438)   ?Support Person Gerilyn Pilgrim HIV Non Reactive (12/09 8299)  ?Prenatal Classes  Varicella  non immune  ?  GBS Negative/-- (02/20 0440)(For PCN allergy, check sensitivities) negative  ?BTL Consent No    ?VBAC Consent Primary 2016 Pap  ASCUS/-HPV PAP March '22  ?  Hgb Electro    ?Pelvis Tested  CF   ?   SMA   ?     ? ?  ?   ? ?  ? ? ? ?Past Medical History:  ?Diagnosis Date  ? Anxiety   ? Depression   ? ? ?Past Surgical History:  ?Procedure Laterality Date  ? CESAREAN SECTION N/A   ? ? ?History reviewed. No pertinent family history. ?Social History:  reports that she has been smoking. She has never used smokeless tobacco. She reports that she does not drink alcohol and does not use drugs. ? ?Allergies: No Known Allergies ? ?Medications Prior to Admission  ?Medication Sig Dispense Refill  ? Prenatal Vit-Fe Fumarate-FA (MULTIVITAMIN-PRENATAL) 27-0.8 MG TABS tablet Take 1 tablet by mouth daily at 12 noon.    ? ? ?Results for orders placed or performed during the hospital encounter of 12/06/21 (from the past 48 hour(s))  ?CBC     Status: Abnormal  ? Collection Time: 12/06/21  5:33 AM  ?Result Value Ref Range  ? WBC 18.4 (H) 4.0 - 10.5 K/uL  ? RBC 4.24 3.87 - 5.11 MIL/uL  ? Hemoglobin 12.3 12.0 - 15.0 g/dL  ? HCT 37.1 36.0 - 46.0 %  ? MCV 87.5 80.0 - 100.0 fL  ? MCH 29.0 26.0 - 34.0 pg  ? MCHC 33.2 30.0 - 36.0 g/dL  ? RDW 13.8 11.5 - 15.5 %  ? Platelets 311 150 - 400 K/uL  ? nRBC 0.0 0.0 - 0.2 %  ?  Comment: Performed at [facility], [address] [place], [place] [address]  ?Type and screen [facility]     Status: None (Preliminary result)  ? Collection Time: 12/06/21  6:04 AM  ?Result Value Ref Range  ? ABO/RH(D) PENDING   ? Antibody Screen PENDING   ? Sample Expiration    ?  12/09/2021,2359 ?Performed at [facility], [address] [place], [place] [address] ?  ? ?No results found. ? ?Review of Systems  ?Constitutional:  Negative for chills and fever.  ?HENT:  Negative for congestion, hearing loss and sinus pain.   ?Respiratory:  Negative for cough, shortness of breath and wheezing.   ?Cardiovascular:  Negative for chest pain, palpitations and leg swelling.  ?Gastrointestinal:  Negative for abdominal pain, constipation, diarrhea, nausea and vomiting.  ?Genitourinary:  Negative for dysuria,  flank pain, frequency, hematuria and urgency.  ?Musculoskeletal:  Negative for back pain.  ?Skin:  Negative for rash.  ?Neurological:  Negative for dizziness and headaches.  ?Psychiatric/Behavioral:  Negative for suicidal ideas. The patient is not nervous/anxious.   ? ?Last menstrual period 02/08/2021. ?Physical Exam ?Vitals and nursing note reviewed.  ?Constitutional:   ?   Appearance: Normal appearance. She is well-developed.  ?HENT:  ?   Head: Normocephalic and atraumatic.  ?Cardiovascular:  ?   Rate and Rhythm: Normal rate and regular rhythm.  ?Pulmonary:  ?   Effort: Pulmonary effort is normal.  ?   Breath sounds: Normal breath sounds.  ?Abdominal:  ?   General: Bowel sounds are normal.  ?   Palpations: Abdomen is soft.  ?Musculoskeletal:     ?  General: Normal range of motion.  ?Skin: ?   General: Skin is warm and dry.  ?Neurological:  ?   Mental Status: She is alert and oriented to person, place, and time.  ?Psychiatric:     ?   Behavior: Behavior normal.     ?   Thought Content: Thought content normal.     ?   Judgment: Judgment normal.  ?  ?NST: 120 bpm baseline, moderate variability, 15x15 accelerations, no decelerations. ?Tocometer : every 2-3 minutes ? ? ?Assessment/Plan ?21 y.o. G2P1001 [redacted]w[redacted]d here for active labor. She desires a TOLAC. ? ?Expectant management of labor.  ?Normal fetal monitoring per unit policy. ?Reviewed option for pain management with patient. Patient does desire an epidural.  ?Typical admission labs. ?GBS: negative ? ?Adelene Idler MD, FACOG ?[facility] ?12/06/2021 ?6:25 AM ? ? ? ?Natale Milch, MD ?12/06/2021, 6:14 AM ? ? ? ?

## 2021-12-06 NOTE — Anesthesia Procedure Notes (Signed)
Epidural ?Patient location during procedure: OB ?Start time: 12/06/2021 6:34 AM ?End time: 12/06/2021 6:37 AM ? ?Staffing ?Anesthesiologist: Lenard Simmer, MD ?Performed: anesthesiologist  ? ?Preanesthetic Checklist ?Completed: patient identified, IV checked, site marked, risks and benefits discussed, surgical consent, monitors and equipment checked, pre-op evaluation and timeout performed ? ?Epidural ?Patient position: sitting ?Prep: ChloraPrep ?Patient monitoring: heart rate, continuous pulse ox and blood pressure ?Approach: midline ?Location: L2-L3 ?Injection technique: LOR saline ? ?Needle:  ?Needle type: Tuohy  ?Needle gauge: 17 G ?Needle length: 9 cm and 9 ?Needle insertion depth: 4 cm ?Catheter type: closed end flexible ?Catheter size: 19 Gauge ?Catheter at skin depth: 9 cm ?Test dose: negative and 1.5% lidocaine with Epi 1:200 K ? ?Assessment ?Sensory level: T10 ?Events: blood not aspirated, injection not painful, no injection resistance, no paresthesia and negative IV test ? ?Additional Notes ?1st attempt ?Pt. Evaluated and documentation done after procedure finished. ?Patient identified. Risks/Benefits/Options discussed with patient including but not limited to bleeding, infection, nerve damage, paralysis, failed block, incomplete pain control, headache, blood pressure changes, nausea, vomiting, reactions to medication both or allergic, itching and postpartum back pain. Confirmed with bedside nurse the patient's most recent platelet count. Confirmed with patient that they are not currently taking any anticoagulation, have any bleeding history or any family history of bleeding disorders. Patient expressed understanding and wished to proceed. All questions were answered. Sterile technique was used throughout the entire procedure. Please see nursing notes for vital signs. Test dose was given through epidural catheter and negative prior to continuing to dose epidural or start infusion. Warning signs of high  block given to the patient including shortness of breath, tingling/numbness in hands, complete motor block, or any concerning symptoms with instructions to call for help. Patient was given instructions on fall risk and not to get out of bed. All questions and concerns addressed with instructions to call with any issues or inadequate analgesia.   ? ?Patient tolerated the insertion well without immediate complications.Reason for block:procedure for pain ? ? ? ?

## 2021-12-06 NOTE — Discharge Summary (Signed)
? ?  Postpartum Discharge Summary ? ?Date of Service updated 12/07/2021 ? ?   ?Patient Name: Danielle Sheppard ?DOB: Aug 31, 2001 ?MRN: [id number] ? ?Date of admission: 12/06/2021 ?Delivery date:12/06/2021  ?Delivering provider: Adrian Prows R  ?Date of discharge: 12/07/2021 ? ?Admitting diagnosis: Uterine contractions during pregnancy [O47.9] ?Intrauterine pregnancy: [redacted]w[redacted]d    ?Secondary diagnosis:  Principal Problem: ?  Uterine contractions during pregnancy ? ?Additional problems: postpartum hemorrhage    ?Discharge diagnosis: Term Pregnancy Delivered                                              ?Post partum procedures: none ?Augmentation: AROM ?Complications: [id number]? ?Hospital course: Onset of Labor With Vaginal Delivery      ?21y.o. yo G2P1001 at 23w1das admitted in Latent Labor on 12/06/2021. Patient had an uncomplicated labor course as follows:  ?Membrane Rupture Time/Date: 10:06 AM ,12/06/2021   ?Delivery Method:VBAC, Spontaneous  ?Episiotomy: None  ?Lacerations:  Periurethral;Labial  ?Patient had an uncomplicated postpartum course.  She is ambulating, tolerating a regular diet, passing flatus, and urinating well. Patient is discharged home in stable condition on 12/07/21. ? ?Newborn Data: ?Birth date:12/06/2021  ?Birth time:1:32 PM  ?Gender:Female  ?Living status:Living  ?Apgars:8 ,9  ?Weight:3580 g  ? ?Magnesium Sulfate received: No ?BMZ received: No ?Rhophylac:No ?MMR:No ?T-DaP:Given prenatally ?Flu: No ?Transfusion:No ? ?Physical exam  ?Vitals:  ? 12/06/21 1950 12/07/21 0105 12/07/21 0424 12/07/21 0925  ?BP: 108/72 120/68 103/65 103/67  ?Pulse: 94 92 86 93  ?Resp: _0 ?Temp:  98 ?F (36.7 ?C) 97.8 ?F (36.6 ?C) 98 ?F (36.7 ?C)  ?TempSrc:  Oral Axillary Oral  ?SpO2: 99% 100% (!) 87% 98%  ?Weight:      ?Height:      ? ?General: alert, cooperative, and no distress ?Lochia: appropriate ?Uterine Fundus: firm ?Incision: none ?DVT Evaluation: No evidence of DVT seen on physical exam. ?Labs: ?Lab  Results  ?Component Value Date  ? WBC 17.4 (H) 12/07/2021  ? HGB 9.3 (L) 12/07/2021  ? HCT 28.7 (L) 12/07/2021  ? MCV 87.5 12/07/2021  ? PLT 242 12/07/2021  ? ?CMP Latest Ref Rng & Units 01/04/2016  ?Glucose 65 - 99 mg/dL 82  ?BUN 6 - 20 mg/dL 8  ?Creatinine 0.50 - 1.00 mg/dL 0.85  ?Sodium 135 - 145 mmol/L 134(L)  ?Potassium 3.5 - 5.1 mmol/L 3.8  ?Chloride 101 - 111 mmol/L 102  ?CO2 22 - 32 mmol/L 23  ?Calcium 8.9 - 10.3 mg/dL 9.6  ?Total Protein 6.5 - 8.1 g/dL 8.1  ?Total Bilirubin 0.3 - 1.2 mg/dL 1.0  ?Alkaline Phos 47 - 119 U/L 83  ?AST 15 - 41 U/L 20  ?ALT 14 - 54 U/L 15  ? ?Edinburgh Score: ?Edinburgh Postnatal Depression Scale Screening Tool 12/07/2021  ?I have been able to laugh and see the funny side of things. (No Data)  ? ? ? ? ?After visit meds:  ?Allergies as of 12/07/2021   ?No Known Allergies ?  ? ?  ?Medication List  ?  ? ?TAKE these medications   ? ?acetaminophen 500 MG tablet ?Commonly known as: TYLENOL ?Take 2 tablets (1,000 mg total) by mouth every 6 (six) hours. ?  ?docusate sodium 100 MG capsule ?Commonly known as: COLACE ?Take 1 capsule (100 mg total) by mouth 2 (two) times daily. ?  ?ibuprofen 600 MG  tablet ?Commonly known as: ADVIL ?Take 1 tablet (600 mg total) by mouth every 6 (six) hours. ?  ?multivitamin-prenatal 27-0.8 MG Tabs tablet ?Take 1 tablet by mouth daily at 12 noon. ?  ? ?  ? ? ? ?Discharge home in stable condition ?Infant Feeding: Breast ?Infant Disposition:home with mother ?Discharge instruction: per After Visit Summary and Postpartum booklet. ?Activity: Advance as tolerated. Pelvic rest for 6 weeks.  ?Diet: routine diet ?Anticipated Birth Control: Unsure ?Postpartum Appointment:2 weeks ?Additional Postpartum F/U:  none ?Future Appointments: ?Future Appointments  ?Date Time Provider [place]  ?12/08/2021 10:15 AM Imagene Riches, CNM [facility] None  ? ?Follow up Visit: ? Follow-up Information   ? ? Calliope Delangel, Stefanie Libel, MD Follow up in 2 week(s).   ?Specialty: Obstetrics and  Gynecology ?Why: postpartum visit ?Contact information: ?[address] ?[place] [address] ?[phone] ? ? ?  ?  ? ?  ?  ? ?  ? ? ? ?  ? ?12/07/2021 ?Homero Fellers, MD ? ? ?

## 2021-12-07 LAB — CBC
HCT: 28.7 % — ABNORMAL LOW (ref 36.0–46.0)
Hemoglobin: 9.3 g/dL — ABNORMAL LOW (ref 12.0–15.0)
MCH: 28.4 pg (ref 26.0–34.0)
MCHC: 32.4 g/dL (ref 30.0–36.0)
MCV: 87.5 fL (ref 80.0–100.0)
Platelets: 242 10*3/uL (ref 150–400)
RBC: 3.28 MIL/uL — ABNORMAL LOW (ref 3.87–5.11)
RDW: 13.8 % (ref 11.5–15.5)
WBC: 17.4 10*3/uL — ABNORMAL HIGH (ref 4.0–10.5)
nRBC: 0 % (ref 0.0–0.2)

## 2021-12-07 LAB — RPR: RPR Ser Ql: NONREACTIVE

## 2021-12-07 MED ORDER — DOCUSATE SODIUM 100 MG PO CAPS: 100.0000 mg | ORAL_CAPSULE | Freq: Two times a day (BID) | ORAL | 0 refills | Status: DC

## 2021-12-07 MED ORDER — IBUPROFEN 600 MG PO TABS: 600.0000 mg | ORAL_TABLET | Freq: Four times a day (QID) | ORAL | 0 refills | Status: DC

## 2021-12-07 MED ORDER — ACETAMINOPHEN 500 MG PO TABS: 1000.0000 mg | ORAL_TABLET | Freq: Four times a day (QID) | ORAL | 0 refills | Status: DC

## 2021-12-07 NOTE — Progress Notes (Signed)
?Subjective:  ?She is feeling well Pain control is adequate. Voiding without difficulty. Tolerating a regular diet. Ambulating well. ? ?Objective:  ? ?Blood pressure 103/67, pulse 93, temperature 98 ?F (36.7 ?C), temperature source Oral, resp. rate 17, height 5\' 8"  (1.727 m), weight 100.2 kg, last menstrual period 02/08/2021, SpO2 98 %, unknown if currently breastfeeding. ? ?General: NAD ?Pulmonary: no increased work of breathing ?Abdomen: non-distended, non-tender ?Uterus:  fundus firm at U; lochia normal ?Extremities: no edema, no erythema, no tenderness, no signs of DVT ? ?Results for orders placed or performed during the hospital encounter of 12/06/21 (from the past 72 hour(s))  ?CBC     Status: Abnormal  ? Collection Time: 12/06/21  5:33 AM  ?Result Value Ref Range  ? WBC 18.4 (H) 4.0 - 10.5 K/uL  ? RBC 4.24 3.87 - 5.11 MIL/uL  ? Hemoglobin 12.3 12.0 - 15.0 g/dL  ? HCT 37.1 36.0 - 46.0 %  ? MCV 87.5 80.0 - 100.0 fL  ? MCH 29.0 26.0 - 34.0 pg  ? MCHC 33.2 30.0 - 36.0 g/dL  ? RDW 13.8 11.5 - 15.5 %  ? Platelets 311 150 - 400 K/uL  ? nRBC 0.0 0.0 - 0.2 %  ?  Comment: Performed at [facility], [address] [place], [place]  ?Resp Panel by RT-PCR (Flu A&B, Covid) Nasopharyngeal Swab     Status: None  ? Collection Time: 12/06/21  5:33 AM  ? Specimen: Nasopharyngeal Swab; Nasopharyngeal(NP) swabs in vial transport medium  ?Result Value Ref Range  ? SARS Coronavirus 2 by RT PCR NEGATIVE NEGATIVE  ?  Comment: (NOTE) ?SARS-CoV-2 target nucleic acids are NOT DETECTED. ? ?The SARS-CoV-2 RNA is generally detectable in upper respiratory ?specimens during the acute phase of infection. The lowest ?concentration of SARS-CoV-2 viral copies this assay can detect is ?138 copies/mL. A negative result does not preclude SARS-Cov-2 ?infection and should not be used as the sole basis for treatment or ?other patient management decisions. A negative result may occur with  ?improper specimen collection/handling,  submission of specimen other ?than nasopharyngeal swab, presence of viral mutation(s) within the ?areas targeted by this assay, and inadequate number of viral ?copies(<138 copies/mL). A negative result must be combined with ?clinical observations, patient history, and epidemiological ?information. The expected result is Negative. ? ?Fact Sheet for Patients:  ?02/05/22 ? ?Fact Sheet for Healthcare Providers:  ?BloggerCourse.com ? ?This test is no t yet approved or cleared by the [place] FDA and  ?has been authorized for detection and/or diagnosis of SARS-CoV-2 by ?FDA under an Emergency Use Authorization (EUA). This EUA will remain  ?in effect (meaning this test can be used) for the duration of the ?COVID-19 declaration under Section 564(b)(1) of the Act, 21 ?U.S.C.section 360bbb-3(b)(1), unless the authorization is terminated  ?or revoked sooner.  ? ? ?  ? Influenza A by PCR NEGATIVE NEGATIVE  ? Influenza B by PCR NEGATIVE NEGATIVE  ?  Comment: (NOTE) ?The Xpert Xpress SARS-CoV-2/FLU/RSV plus assay is intended as an aid ?in the diagnosis of influenza from Nasopharyngeal swab specimens and ?should not be used as a sole basis for treatment. Nasal washings and ?aspirates are unacceptable for Xpert Xpress SARS-CoV-2/FLU/RSV ?testing. ? ?Fact Sheet for Patients: ?[place] ? ?Fact Sheet for Healthcare Providers: ?BloggerCourse.com ? ?This test is not yet approved or cleared by the [employer] FDA and ?has been authorized for detection and/or diagnosis of SARS-CoV-2 by ?FDA under an Emergency Use Authorization (EUA). This EUA will remain ?  in effect (meaning this test can be used) for the duration of the ?COVID-19 declaration under Section 564(b)(1) of the Act, 21 U.S.C. ?section 360bbb-3(b)(1), unless the authorization is terminated or ?revoked. ? ?Performed at [facility], [address] [place], ?[place] [address] ?  ?Type and screen [facility]     Status: None (Preliminary result)  ? Collection Time: 12/06/21  6:04 AM  ?Result Value Ref Range  ? ABO/RH(D) PENDING   ? Antibody Screen PENDING   ? Sample Expiration    ?  12/09/2021,2359 ?Performed at [facility], [address] [place], [place] [address] ?  ?Rapid HIV screen (HIV 1/2 Ab+Ag)     Status: None  ? Collection Time: 12/06/21  6:16 AM  ?Result Value Ref Range  ? HIV-1 P24 Antigen - HIV24 NON REACTIVE NON REACTIVE  ?  Comment: (NOTE) ?Detection of p24 may be inhibited by biotin in the sample, causing ?false negative results in acute infection. ?  ? HIV 1/2 Antibodies NON REACTIVE NON REACTIVE  ? Interpretation (HIV Ag Ab)    ?  A non reactive test result means that HIV 1 or HIV 2 antibodies and HIV 1 p24 antigen were not detected in the specimen.  ?  Comment: Performed at [facility], [address] [place], [place] [address]  ?Type and screen     Status: None (Preliminary result)  ? Collection Time: 12/06/21  6:16 AM  ?Result Value Ref Range  ? ABO/RH(D) PENDING   ? Antibody Screen PENDING   ? Sample Expiration    ?  12/09/2021,2359 ?Performed at [facility], [address] [place], [place] [address] ?  ?ABO/Rh     Status: None  ? Collection Time: 12/06/21  6:51 AM  ?Result Value Ref Range  ? ABO/RH(D)    ?  A POS ?Performed at [facility], [address] [place], [place] [address] ?  ?Type and screen     Status: None  ? Collection Time: 12/06/21  7:38 AM  ?Result Value Ref Range  ? ABO/RH(D) A POS   ? Antibody Screen NEG   ? Sample Expiration    ?  12/09/2021,2359 ?Performed at [facility], [address] [place], [place] [address] ?  ?CBC     Status: Abnormal  ? Collection Time: 12/07/21  6:01 AM  ?Result Value Ref Range  ? WBC 17.4 (H) 4.0 - 10.5 K/uL  ? RBC 3.28 (L) 3.87 - 5.11 MIL/uL  ? Hemoglobin 9.3 (L) 12.0 - 15.0 g/dL  ? HCT 28.7 (L) 36.0 - 46.0 %  ?  MCV 87.5 80.0 - 100.0 fL  ? MCH 28.4 26.0 - 34.0 pg  ? MCHC 32.4 30.0 - 36.0 g/dL  ? RDW 13.8 11.5 - 15.5 %  ? Platelets 242 150 - 400 K/uL  ? nRBC 0.0 0.0 - 0.2 %  ?  Comment: Performed at [facility], [address] [place], [place] [address]  ? ? ?Assessment:  ? 21 y.o. G2P1001 postpartum day # 1 ? ?Plan:  ? ?1) Acute blood loss anemia - hemodynamically stable and asymptomatic ?- po ferrous sulfate ? ?2) Blood Type --/--/A POS (03/11 [id number])  ? ?3) Rubella 4.24 (10/14 1438) / Varicella Non-immune ? ?4) TDAP status - up to date ?Immunization History  ?Administered Date(s) Administered  ? Influenza,inj,Quad PF,6+ Mos 08/08/2021  ? Tdap 10/13/2021  ? ? ?5) Feeding- breast  ? ?6) Contraception - not discussed ? ?  7) Disposition - desires discharge home today. ? ?Adelene Idler MD ?[facility], [facility] ?12/07/2021 ?10:46 AM ? ? ? ? ?

## 2021-12-07 NOTE — Progress Notes (Signed)
Patient discharged home with family.  Discharge instructions, when to follow up, and prescriptions reviewed with patient.  Patient verbalized understanding. Patient will be escorted out by auxiliary.   

## 2021-12-07 NOTE — Discharge Instructions (Signed)

## 2021-12-07 NOTE — TOC Progression Note (Addendum)
Transition of Care (TOC) - Progression Note  ? ? ?Patient Details  ?Name: Danielle Sheppard ?MRN: [id number] ?Date of Birth: Oct 16, 2000 ? ?Transition of Care (TOC) CM/SW Contact  ?Fardeen Steinberger L Etta Gassett, LCSWA ?Phone Number: ?12/07/2021, 2:52 PM ? ?Clinical Narrative:    ? ?CSW spoke to patient regarding high edinburgh score. Patient reported having strong familial support upon discharge (parents and husband). Patient reports having essential baby items. Patient has plan for [facility] baby. Patient reported being diagnosed with depression and having medication. Patient reported plans to consult with current PCP.  ? ?CSW does not have any concerns. No further TOC needs.  ? ?  ?  ? ?Expected Discharge Plan and Services ?  ?  ?  ?  ?  ?Expected Discharge Date: 12/07/21               ?  ?  ?  ?  ?  ?  ?  ?  ?  ?  ? ? ?Social Determinants of Health (SDOH) Interventions ?  ? ?Readmission Risk Interventions ?No flowsheet data found. ? ?

## 2021-12-08 ENCOUNTER — Encounter: Payer: BC Managed Care – PPO | Admitting: Obstetrics

## 2021-12-16 ENCOUNTER — Encounter: Payer: Self-pay | Admitting: Anesthesiology

## 2021-12-16 NOTE — Anesthesia Preprocedure Evaluation (Signed)
Anesthesia Evaluation  ?Patient identified by MRN, date of birth, ID band ?Patient awake ? ? ? ?Reviewed: ?Allergy & Precautions, H&P , NPO status , Patient's Chart, lab work & pertinent test results, reviewed documented beta blocker date and time  ? ?History of Anesthesia Complications ?Negative for: history of anesthetic complications ? ?Airway ?Mallampati: II ? ?TM Distance: >3 FB ?Neck ROM: full ? ? ? Dental ?no notable dental hx. ? ?  ?Pulmonary ?neg shortness of breath, neg COPD, neg recent URI, Current Smoker,  ?  ?Pulmonary exam normal ?breath sounds clear to auscultation ? ? ? ? ? ? Cardiovascular ?Exercise Tolerance: Good ?negative cardio ROS ?Normal cardiovascular exam ?Rhythm:regular Rate:Normal ? ? ?  ?Neuro/Psych ?PSYCHIATRIC DISORDERS Anxiety Depression negative neurological ROS ?   ? GI/Hepatic ?Neg liver ROS, GERD  ,  ?Endo/Other  ?negative endocrine ROS ? Renal/GU ?negative Renal ROS  ?negative genitourinary ?  ?Musculoskeletal ? ? Abdominal ?  ?Peds ? Hematology ?negative hematology ROS ?(+)   ?Anesthesia Other Findings ?Past Medical History: ?No date: Anxiety ?No date: Depression ? ? Reproductive/Obstetrics ?(+) Pregnancy ? ?  ? ? ? ? ? ? ? ? ? ? ? ? ? ?  ?  ? ? ? ? ? ? ? ? ?Anesthesia Physical ?Anesthesia Plan ? ?ASA: 2 ? ?Anesthesia Plan: Epidural  ? ?Post-op Pain Management:   ? ?Induction:  ? ?PONV Risk Score and Plan:  ? ?Airway Management Planned:  ? ?Additional Equipment:  ? ?Intra-op Plan:  ? ?Post-operative Plan:  ? ?Informed Consent: I have reviewed the patients History and Physical, chart, labs and discussed the procedure including the risks, benefits and alternatives for the proposed anesthesia with the patient or authorized representative who has indicated his/her understanding and acceptance.  ? ? ? ?Dental Advisory Given ? ?Plan Discussed with: Anesthesiologist, CRNA and Surgeon ? ?Anesthesia Plan Comments:   ? ? ? ? ? ? ?Anesthesia Quick  Evaluation ? ?

## 2021-12-17 NOTE — Anesthesia Postprocedure Evaluation (Signed)
Anesthesia Post Note ? ?Patient: Danielle Sheppard ? ?Procedure(s) Performed: AN AD HOC LABOR EPIDURAL ? ?Patient location during evaluation: Mother Baby ?Anesthesia Type: Epidural ?Level of consciousness: awake and alert ?Pain management: pain level controlled ?Vital Signs Assessment: post-procedure vital signs reviewed and stable ?Respiratory status: spontaneous breathing, nonlabored ventilation and respiratory function stable ?Cardiovascular status: stable ?Postop Assessment: no headache, no backache and epidural receding ?Anesthetic complications: no ? ? ?No notable events documented. ? ? ?Last Vitals:  ?Vitals:  ? 12/07/21 0424 12/07/21 0925  ?BP: 103/65 103/67  ?Pulse: 86 93  ?Resp: 18 17  ?Temp: 36.6 ?C 36.7 ?C  ?SpO2: (!) 87% 98%  ?  ?Last Pain:  ?Vitals:  ? 12/07/21 0925  ?TempSrc: Oral  ?PainSc: 3   ? ? ?  ?  ?  ?  ?  ?  ? ?Molli Barrows ? ? ? ? ?

## 2022-06-29 ENCOUNTER — Encounter (HOSPITAL_COMMUNITY): Payer: Self-pay | Admitting: Emergency Medicine

## 2022-06-29 ENCOUNTER — Emergency Department (HOSPITAL_COMMUNITY): Payer: BC Managed Care – PPO

## 2022-06-29 ENCOUNTER — Inpatient Hospital Stay (HOSPITAL_COMMUNITY)
Admission: EM | Admit: 2022-06-29 | Discharge: 2022-07-01 | DRG: 152 | Disposition: A | Discharge status: Home / Self Care | Payer: BC Managed Care – PPO | Attending: Internal Medicine | Admitting: Internal Medicine

## 2022-06-29 DIAGNOSIS — F17291 Nicotine dependence, other tobacco product, in remission: Secondary | ICD-10-CM | POA: Diagnosis not present

## 2022-06-29 DIAGNOSIS — J9600 Acute respiratory failure, unspecified whether with hypoxia or hypercapnia: Secondary | ICD-10-CM

## 2022-06-29 DIAGNOSIS — F17290 Nicotine dependence, other tobacco product, uncomplicated: Secondary | ICD-10-CM | POA: Diagnosis present

## 2022-06-29 DIAGNOSIS — J069 Acute upper respiratory infection, unspecified: Principal | ICD-10-CM | POA: Diagnosis present

## 2022-06-29 DIAGNOSIS — R000 Tachycardia, unspecified: Secondary | ICD-10-CM | POA: Diagnosis not present

## 2022-06-29 DIAGNOSIS — R0602 Shortness of breath: Secondary | ICD-10-CM | POA: Diagnosis not present

## 2022-06-29 DIAGNOSIS — J9601 Acute respiratory failure with hypoxia: Secondary | ICD-10-CM | POA: Diagnosis present

## 2022-06-29 DIAGNOSIS — B9789 Other viral agents as the cause of diseases classified elsewhere: Secondary | ICD-10-CM | POA: Diagnosis present

## 2022-06-29 DIAGNOSIS — F32A Depression, unspecified: Secondary | ICD-10-CM | POA: Diagnosis present

## 2022-06-29 DIAGNOSIS — Z8616 Personal history of COVID-19: Secondary | ICD-10-CM

## 2022-06-29 DIAGNOSIS — F419 Anxiety disorder, unspecified: Secondary | ICD-10-CM | POA: Diagnosis present

## 2022-06-29 DIAGNOSIS — F17210 Nicotine dependence, cigarettes, uncomplicated: Secondary | ICD-10-CM | POA: Diagnosis present

## 2022-06-29 DIAGNOSIS — J9801 Acute bronchospasm: Secondary | ICD-10-CM

## 2022-06-29 DIAGNOSIS — Z28310 Unvaccinated for covid-19: Secondary | ICD-10-CM

## 2022-06-29 DIAGNOSIS — Z720 Tobacco use: Secondary | ICD-10-CM | POA: Diagnosis not present

## 2022-06-29 LAB — BASIC METABOLIC PANEL
Anion gap: 9 (ref 5–15)
BUN: 12 mg/dL (ref 6–20)
CO2: 21 mmol/L — ABNORMAL LOW (ref 22–32)
Calcium: 9.4 mg/dL (ref 8.9–10.3)
Chloride: 109 mmol/L (ref 98–111)
Creatinine, Ser: 0.8 mg/dL (ref 0.44–1.00)
GFR, Estimated: >60 mL/min (ref >60)
Glucose, Bld: 94 mg/dL (ref 70–99)
Potassium: 3.3 mmol/L — ABNORMAL LOW (ref 3.5–5.1)
Sodium: 139 mmol/L (ref 135–145)

## 2022-06-29 LAB — CBC
HCT: 43.3 % (ref 36.0–46.0)
Hemoglobin: 14.7 g/dL (ref 12.0–15.0)
MCH: 28.1 pg (ref 26.0–34.0)
MCHC: 33.9 g/dL (ref 30.0–36.0)
MCV: 82.6 fL (ref 80.0–100.0)
Platelets: 274 10*3/uL (ref 150–400)
RBC: 5.24 MIL/uL — ABNORMAL HIGH (ref 3.87–5.11)
RDW: 14.1 % (ref 11.5–15.5)
WBC: 13.9 10*3/uL — ABNORMAL HIGH (ref 4.0–10.5)
nRBC: 0 % (ref 0.0–0.2)

## 2022-06-29 LAB — RESPIRATORY PANEL BY PCR

## 2022-06-29 LAB — I-STAT BETA HCG BLOOD, ED (MC, WL, AP ONLY): I-stat hCG, quantitative: <5 m[IU]/mL (ref <5)

## 2022-06-29 LAB — TROPONIN I (HIGH SENSITIVITY)
Troponin I (High Sensitivity): <2 ng/L (ref <18)
Troponin I (High Sensitivity): 2 ng/L (ref <18)

## 2022-06-29 LAB — D-DIMER, QUANTITATIVE: D-Dimer, Quant: 1.32 ug/mL-FEU — ABNORMAL HIGH (ref 0.00–0.50)

## 2022-06-29 LAB — BRAIN NATRIURETIC PEPTIDE: B Natriuretic Peptide: 12 pg/mL (ref 0.0–100.0)

## 2022-06-29 MED ORDER — GUAIFENESIN ER 600 MG PO TB12
1200.0000 mg | ORAL_TABLET | Freq: Two times a day (BID) | ORAL | Status: DC
  Administered 2022-06-29 – 2022-07-01 (×5): 1200 mg via ORAL
  Filled 2022-06-29 (×5): qty 2

## 2022-06-29 MED ORDER — CHLORHEXIDINE GLUCONATE CLOTH 2 % EX PADS
6.0000 | MEDICATED_PAD | Freq: Every day | CUTANEOUS | Status: DC
  Administered 2022-06-30: 6 via TOPICAL

## 2022-06-29 MED ORDER — DEXTROMETHORPHAN POLISTIREX ER 30 MG/5ML PO SUER
30.0000 mg | Freq: Two times a day (BID) | ORAL | Status: DC
  Administered 2022-06-29 – 2022-07-01 (×4): 30 mg via ORAL
  Filled 2022-06-29 (×6): qty 5

## 2022-06-29 MED ORDER — METHYLPREDNISOLONE SODIUM SUCC 40 MG IJ SOLR
40.0000 mg | Freq: Two times a day (BID) | INTRAMUSCULAR | Status: DC
  Administered 2022-06-29 – 2022-07-01 (×4): 40 mg via INTRAVENOUS
  Filled 2022-06-29 (×4): qty 1

## 2022-06-29 MED ORDER — MAGNESIUM SULFATE 2 GM/50ML IV SOLN
2.0000 g | Freq: Once | INTRAVENOUS | Status: AC
  Administered 2022-06-29: 2 g via INTRAVENOUS
  Filled 2022-06-29: qty 50

## 2022-06-29 MED ORDER — ONDANSETRON HCL 4 MG/2ML IJ SOLN
4.0000 mg | Freq: Four times a day (QID) | INTRAMUSCULAR | Status: DC | PRN
  Administered 2022-06-29 – 2022-06-30 (×2): 4 mg via INTRAVENOUS
  Filled 2022-06-29 (×2): qty 2

## 2022-06-29 MED ORDER — TRAZODONE HCL 50 MG PO TABS: 50.0000 mg | ORAL_TABLET | Freq: Every evening | ORAL | Status: DC | PRN

## 2022-06-29 MED ORDER — NICOTINE 14 MG/24HR TD PT24
14.0000 mg | MEDICATED_PATCH | Freq: Every day | TRANSDERMAL | Status: DC | PRN
  Administered 2022-06-29: 14 mg via TRANSDERMAL
  Filled 2022-06-29: qty 1

## 2022-06-29 MED ORDER — IPRATROPIUM-ALBUTEROL 0.5-2.5 (3) MG/3ML IN SOLN
3.0000 mL | Freq: Four times a day (QID) | RESPIRATORY_TRACT | Status: DC
  Administered 2022-06-29 (×2): 3 mL via RESPIRATORY_TRACT
  Filled 2022-06-29 (×2): qty 3

## 2022-06-29 MED ORDER — ONDANSETRON HCL 4 MG PO TABS: 4.0000 mg | ORAL_TABLET | Freq: Four times a day (QID) | ORAL | Status: DC | PRN

## 2022-06-29 MED ORDER — POTASSIUM CHLORIDE CRYS ER 20 MEQ PO TBCR
40.0000 meq | EXTENDED_RELEASE_TABLET | Freq: Once | ORAL | Status: AC
  Administered 2022-06-29: 40 meq via ORAL
  Filled 2022-06-29: qty 2

## 2022-06-29 MED ORDER — IOHEXOL 350 MG/ML SOLN
75.0000 mL | Freq: Once | INTRAVENOUS | Status: AC | PRN
  Administered 2022-06-29: 75 mL via INTRAVENOUS

## 2022-06-29 MED ORDER — ALBUTEROL SULFATE HFA 108 (90 BASE) MCG/ACT IN AERS: 2.0000 | INHALATION_SPRAY | RESPIRATORY_TRACT | Status: DC | PRN

## 2022-06-29 MED ORDER — ACETAMINOPHEN 650 MG RE SUPP: 650.0000 mg | Freq: Four times a day (QID) | RECTAL | Status: DC | PRN

## 2022-06-29 MED ORDER — ALBUTEROL SULFATE (2.5 MG/3ML) 0.083% IN NEBU: 2.5000 mg | INHALATION_SOLUTION | Freq: Once | RESPIRATORY_TRACT | Status: DC

## 2022-06-29 MED ORDER — IPRATROPIUM-ALBUTEROL 0.5-2.5 (3) MG/3ML IN SOLN: 3.0000 mL | Freq: Once | RESPIRATORY_TRACT | Status: DC

## 2022-06-29 MED ORDER — OXYCODONE HCL 5 MG PO TABS
5.0000 mg | ORAL_TABLET | Freq: Four times a day (QID) | ORAL | Status: DC | PRN
  Administered 2022-06-29: 5 mg via ORAL
  Filled 2022-06-29: qty 1

## 2022-06-29 MED ORDER — IPRATROPIUM-ALBUTEROL 0.5-2.5 (3) MG/3ML IN SOLN
3.0000 mL | RESPIRATORY_TRACT | Status: DC | PRN
  Administered 2022-06-30: 3 mL via RESPIRATORY_TRACT
  Filled 2022-06-29: qty 3

## 2022-06-29 MED ORDER — IPRATROPIUM-ALBUTEROL 0.5-2.5 (3) MG/3ML IN SOLN
3.0000 mL | Freq: Three times a day (TID) | RESPIRATORY_TRACT | Status: DC
  Administered 2022-06-30 – 2022-07-01 (×5): 3 mL via RESPIRATORY_TRACT
  Filled 2022-06-29 (×4): qty 3

## 2022-06-29 MED ORDER — BISACODYL 5 MG PO TBEC: 5.0000 mg | DELAYED_RELEASE_TABLET | Freq: Every day | ORAL | Status: DC | PRN

## 2022-06-29 MED ORDER — DOXYCYCLINE HYCLATE 100 MG PO TABS
100.0000 mg | ORAL_TABLET | Freq: Two times a day (BID) | ORAL | Status: DC
  Administered 2022-06-29 – 2022-07-01 (×5): 100 mg via ORAL
  Filled 2022-06-29 (×5): qty 1

## 2022-06-29 MED ORDER — IPRATROPIUM-ALBUTEROL 0.5-2.5 (3) MG/3ML IN SOLN
3.0000 mL | Freq: Once | RESPIRATORY_TRACT | Status: AC
  Administered 2022-06-29: 3 mL via RESPIRATORY_TRACT
  Filled 2022-06-29: qty 3

## 2022-06-29 MED ORDER — ENOXAPARIN SODIUM 40 MG/0.4ML IJ SOSY
40.0000 mg | PREFILLED_SYRINGE | INTRAMUSCULAR | Status: DC
  Administered 2022-06-29: 40 mg via SUBCUTANEOUS
  Filled 2022-06-29 (×2): qty 0.4

## 2022-06-29 MED ORDER — ACETAMINOPHEN 325 MG PO TABS
650.0000 mg | ORAL_TABLET | Freq: Four times a day (QID) | ORAL | Status: DC | PRN
  Administered 2022-06-30 (×3): 650 mg via ORAL
  Filled 2022-06-29 (×3): qty 2

## 2022-06-29 MED ORDER — METHYLPREDNISOLONE SODIUM SUCC 125 MG IJ SOLR
125.0000 mg | Freq: Once | INTRAMUSCULAR | Status: AC
  Administered 2022-06-29: 125 mg via INTRAVENOUS
  Filled 2022-06-29: qty 2

## 2022-06-29 NOTE — Assessment & Plan Note (Signed)
-   Nicotine patch ordered as needed

## 2022-06-29 NOTE — Assessment & Plan Note (Addendum)
-   physiologic reactive from respiratory distress and bronchodilators - I'm comfortable stopping cardiac monitor now that we have monitored for 24 hours -improved

## 2022-06-29 NOTE — Assessment & Plan Note (Addendum)
-   Severe symptoms on presentation  - Continue supplemental oxygen, respiratory support - CTA chest negative for pulmonary embolus - still with severe DOE, continue current treatments as she is improving, not ready for home today, keep in hospital 1 more day and home tomorrow

## 2022-06-29 NOTE — ED Provider Notes (Signed)
[facility] Emergency Department Provider Note MRN:  [id number]  Arrival date & time: 06/29/22     Chief Complaint   Shortness of Breath   History of Present Illness   Danielle Sheppard is a 21 y.o. year-old female with a history of anxiety, depression presenting to the ED with chief complaint of shortness of breath.  Viral illness first experienced by patient 79-month-old son, then her significant other started having mild cold-like symptoms, then the patient did a few days ago.  Getting worse for her with chest congestion, shortness of breath this evening, short of breath at rest, much more short of breath when trying to walk to the bathroom.  Feeling some pain in the chest that is worse with deep breaths.  Feeling a lot of wheezing.  No leg pain or swelling, no history of blood clots, no birth control pills.  Review of Systems  A thorough review of systems was obtained and all systems are negative except as noted in the HPI and PMH.   Patient's Health History    Past Medical History:  Diagnosis Date   Anxiety    Depression     Past Surgical History:  Procedure Laterality Date   CESAREAN SECTION N/A     History reviewed. No pertinent family history.  Social History   Socioeconomic History   Marital status: Married    Spouse name: Not on file   Number of children: Not on file   Years of education: Not on file   Highest education level: Not on file  Occupational History   Not on file  Tobacco Use   Smoking status: Some Days   Smokeless tobacco: Never  Vaping Use   Vaping Use: Every day  Substance and Sexual Activity   Alcohol use: No   Drug use: Never   Sexual activity: Yes    Birth control/protection: None  Other Topics Concern   Not on file  Social History Narrative   Not on file   Social Determinants of Health   Financial Resource Strain: Not on file  Food Insecurity: Not on file  Transportation Needs: Not on file  Physical  Activity: Not on file  Stress: Not on file  Social Connections: Not on file  Intimate Partner Violence: Not on file     Physical Exam   Vitals:   06/29/22 0533 06/29/22 0601  BP: 133/88 (!) 129/96  Pulse: (!) 134 (!) 132  Resp: (!) 26 (!) 24  Temp: 98 F (36.7 C)   SpO2: 93% 96%    CONSTITUTIONAL: Well-appearing, NAD NEURO/PSYCH:  Alert and oriented x 3, no focal deficits EYES:  eyes equal and reactive ENT/NECK:  no LAD, no JVD CARDIO: Tachycardic rate, well-perfused, normal S1 and S2 PULM: Tachypneic, poor air movement, occasional wheeze, can only speak 8-10 words at a time GI/GU:  non-distended, non-tender MSK/SPINE:  No gross deformities, no edema SKIN:  no rash, atraumatic   *Additional and/or pertinent findings included in MDM below  Diagnostic and Interventional Summary    EKG Interpretation  Date/Time:  Monday June 29 2022 05:46:51 EDT Ventricular Rate:  126 PR Interval:  174 QRS Duration: 105 QT Interval:  305 QTC Calculation: 442 R Axis:   82 Text Interpretation: Sinus tachycardia Artifact in lead(s) I II III aVR aVL V1 and baseline wander in lead(s) V2 V3 V4 V5 Confirmed by Gerlene Fee [phone]) on 06/29/2022 7:09:24 AM       Labs Reviewed  CBC - Abnormal; Notable for  the following components:      Result Value   WBC 13.9 (*)    RBC 5.24 (*)    All other components within normal limits  BASIC METABOLIC PANEL - Abnormal; Notable for the following components:   Potassium 3.3 (*)    CO2 21 (*)    All other components within normal limits  D-DIMER, QUANTITATIVE - Abnormal; Notable for the following components:   D-Dimer, Quant 1.32 (*)    All other components within normal limits  BRAIN NATRIURETIC PEPTIDE  I-STAT BETA HCG BLOOD, ED ([facility], [facility], [facility] ONLY)  TROPONIN I (HIGH SENSITIVITY)    DG Chest Portable 1 View  Final Result    CT Angio Chest Pulmonary Embolism (PE) W or WO Contrast    (Results Pending)    Medications  ipratropium-albuterol  (DUONEB) 0.5-2.5 (3) MG/3ML nebulizer solution 3 mL (3 mLs Nebulization Given 06/29/22 0559)  methylPREDNISolone sodium succinate (SOLU-MEDROL) 125 mg/2 mL injection 125 mg (125 mg Intravenous Given 06/29/22 0603)  ipratropium-albuterol (DUONEB) 0.5-2.5 (3) MG/3ML nebulizer solution 3 mL (3 mLs Nebulization Given 06/29/22 0559)     Procedures  /  Critical Care Procedures  ED Course and Medical Decision Making  Initial Impression and Ddx Presentation seems consistent with reactive airway disease however patient has no history of asthma.  She does smoke, she has wheezing.  Breath sounds diminished, left more than right.  Pneumothorax considered, secondary bacterial pneumonia considered.  Given the tachycardia and pleuritic chest pain, PE is also considered.  Awaiting chest x-ray, labs, D-dimer, providing breathing treatments and will reassess.  Past medical/surgical history that increases complexity of ED encounter: None  Interpretation of Diagnostics I personally reviewed the EKG and my interpretation is as follows: Sinus tachycardia  Labs reveal no significant blood count or electrolyte disturbance, troponin negative, D-dimer is elevated.  Patient Reassessment and Ultimate Disposition/Management     Awaiting CTA, signed out to oncoming provider.  Patient management required discussion with the following services or consulting groups:  None  Complexity of Problems Addressed Acute illness or injury that poses threat of life of bodily function  Additional Data Reviewed and Analyzed Further history obtained from: Further history from spouse/family member  Additional Factors Impacting ED Encounter Risk Consideration of hospitalization  Elmer Sow. Pilar Plate, MD [facility] Emergency Medicine [facility] [email or web address]  Final Clinical Impressions(s) / ED Diagnoses     ICD-10-CM   1. Shortness of breath  R06.02       ED Discharge Orders     None         Discharge Instructions Discussed with and Provided to Patient:   Discharge Instructions   None      Sabas Sous, MD 06/29/22 0710

## 2022-06-29 NOTE — H&P (Addendum)
History and Physical  Howard NTI:[id number] DOB: 2001/08/21 DOA: 06/29/2022  PCP: Dion Body, MD  Patient coming from: Home  Level of care: Telemetry  I have personally briefly reviewed patient's old medical records in [place]  Chief Complaint: SOB  HPI: Danielle Sheppard is a 21 year old female with history of anxiety and depression, tobacco use, vape use, unvaccinated for covid but had covid about 6 weeks ago and has a 62 month old at home.  She reports that her husband and son have had symptoms of upper respiratory illness at home and she has started having symptoms.  Her symptoms have progressively gotten worse and severe. She has had severe coughing and wheezing spells, shortness of breath with poor exercise tolerance.  Difficulty lying recumbent.  She has not rested well the last 2 days due to cough congestion shortness of breath.  She has very short of breath with walking to the bathroom.  She has no leg pain or swelling.  She is not on oral contraceptives and no history of blood clots.  Patient has been taking Primatene Mist at home with no improvement.  She was seen in the emergency department due to severe shortness of breath symptoms and tachycardia and treated with albuterol and steroids.  CTA chest negative for PE.  Her symptoms persisted and she remained tachypneic and tachycardic and admission was requested for further management.   Past Medical History:  Diagnosis Date   Anxiety    Depression     Past Surgical History:  Procedure Laterality Date   CESAREAN SECTION N/A      reports that she has been smoking. She has never used smokeless tobacco. She reports that she does not drink alcohol and does not use drugs.  No Known Allergies  History reviewed. No pertinent family history.  Prior to Admission medications   Medication Sig Start Date End Date Taking? Authorizing Provider  EPINEPHrine (PRIMATENE MIST IN) Inhale 1 puff  into the lungs daily as needed (SOB).   Yes [provider]    Physical Exam: Vitals:   06/29/22 0924 06/29/22 0930 06/29/22 1000 06/29/22 1033  BP: 122/75 (!) 126/91 (!) 92/59   Pulse: (!) 130 (!) 123 (!) 116 (!) 144  Resp: 19 19 19  (!) 27  Temp:    100 F (37.8 C)  TempSrc:    Axillary  SpO2: 98% 95% 97% 98%  Weight:      Height:        Constitutional: NAD, calm, comfortable Eyes: PERRL, lids and conjunctivae normal ENMT: Mucous membranes are moist. Posterior pharynx clear of any exudate or lesions.Normal dentition.  Neck: normal, supple, no masses, no thyromegaly Respiratory: diffuse expiratory wheezing heard bilaterally, moderate increased work of breathing.  Cardiovascular: tachycardic rate, normal s1, s2 sounds, no murmurs / rubs / gallops. No extremity edema. 2+ pedal pulses. No carotid bruits.  Abdomen: no tenderness, no masses palpated. No hepatosplenomegaly. Bowel sounds positive.  Musculoskeletal: no clubbing / cyanosis. No joint deformity upper and lower extremities. Good ROM, no contractures. Normal muscle tone.  Skin: no rashes, lesions, ulcers. No induration Neurologic: CN 2-12 grossly intact. Sensation intact, DTR normal. Strength 5/5 in all 4.  Psychiatric: Normal judgment and insight. Alert and oriented x 3. Normal mood.   Labs on Admission: I have personally reviewed following labs and imaging studies  CBC: Recent Labs  Lab 06/29/22 0600  WBC 13.9*  HGB 14.7  HCT 43.3  MCV 82.6  PLT 274  Basic Metabolic Panel: Recent Labs  Lab 06/29/22 0600  NA 139  K 3.3*  CL 109  CO2 21*  GLUCOSE 94  BUN 12  CREATININE 0.80  CALCIUM 9.4   GFR: Estimated Creatinine Clearance: 110.3 mL/min (by C-G formula based on SCr of 0.8 mg/dL). Liver Function Tests: No results for input(s): "AST", "ALT", "ALKPHOS", "BILITOT", "PROT", "ALBUMIN" in the last 168 hours. No results for input(s): "LIPASE", "AMYLASE" in the last 168 hours. No results for input(s):  "AMMONIA" in the last 168 hours. Coagulation Profile: No results for input(s): "INR", "PROTIME" in the last 168 hours. Cardiac Enzymes: No results for input(s): "CKTOTAL", "CKMB", "CKMBINDEX", "TROPONINI" in the last 168 hours. BNP (last 3 results) No results for input(s): "PROBNP" in the last 8760 hours. HbA1C: No results for input(s): "HGBA1C" in the last 72 hours. CBG: No results for input(s): "GLUCAP" in the last 168 hours. Lipid Profile: No results for input(s): "CHOL", "HDL", "LDLCALC", "TRIG", "CHOLHDL", "LDLDIRECT" in the last 72 hours. Thyroid Function Tests: No results for input(s): "TSH", "T4TOTAL", "FREET4", "T3FREE", "THYROIDAB" in the last 72 hours. Anemia Panel: No results for input(s): "VITAMINB12", "FOLATE", "FERRITIN", "TIBC", "IRON", "RETICCTPCT" in the last 72 hours. Urine analysis:    Component Value Date/Time   COLORURINE YELLOW (A) 11/22/2021 2100   APPEARANCEUR CLEAR (A) 11/22/2021 2100   LABSPEC 1.013 11/22/2021 2100   PHURINE 7.0 11/22/2021 2100   GLUCOSEU NEGATIVE 11/22/2021 2100   HGBUR SMALL (A) 11/22/2021 2100   BILIRUBINUR NEGATIVE 11/22/2021 2100   BILIRUBINUR negative 08/08/2021 0948   KETONESUR 20 (A) 11/22/2021 2100   PROTEINUR NEGATIVE 11/22/2021 2100   UROBILINOGEN 0.2 08/08/2021 0948   NITRITE NEGATIVE 11/22/2021 2100   LEUKOCYTESUR NEGATIVE 11/22/2021 2100    Radiological Exams on Admission: CT Angio Chest Pulmonary Embolism (PE) W or WO Contrast  Result Date: 06/29/2022 CLINICAL DATA:  Shortness of breath for 2 days, worsening EXAM: CT ANGIOGRAPHY CHEST WITH CONTRAST TECHNIQUE: Multidetector CT imaging of the chest was performed using the standard protocol during bolus administration of intravenous contrast. Multiplanar CT image reconstructions and MIPs were obtained to evaluate the vascular anatomy. RADIATION DOSE REDUCTION: This exam was performed according to the departmental dose-optimization program which includes automated exposure  control, adjustment of the mA and/or kV according to patient size and/or use of iterative reconstruction technique. CONTRAST:  68mL OMNIPAQUE IOHEXOL 350 MG/ML SOLN COMPARISON:  01/04/2016 FINDINGS: Cardiovascular: Satisfactory opacification of the pulmonary arteries to the segmental level. No evidence of pulmonary embolism. Normal heart size. No pericardial effusion. Mediastinum/Nodes: Negative for adenopathy or mass Lungs/Pleura: Few patchy areas of ground-glass opacity in the upper lobes and right lower lobe, roughly the size of secondary pulmonary lobules. No edema, effusion, or pneumothorax Upper Abdomen: Unremarkable Musculoskeletal: Unremarkable Review of the MIP images confirms the above findings. IMPRESSION: 1. Negative for pulmonary embolism. 2. Few areas of ground-glass opacity that could be early pneumonitis or atelectasis. Electronically Signed   By: Jorje Guild M.D.   On: 06/29/2022 07:46   DG Chest Portable 1 View  Result Date: 06/29/2022 CLINICAL DATA:  Shortness of breath. EXAM: PORTABLE CHEST 1 VIEW COMPARISON:  None Available. FINDINGS: 0609 hours. The lungs are clear without focal pneumonia, edema, pneumothorax or pleural effusion. The cardiopericardial silhouette is within normal limits for size. The visualized bony structures of the thorax are unremarkable. Telemetry leads overlie the chest. IMPRESSION: No active disease. Electronically Signed   By: Misty Stanley M.D.   On: 06/29/2022 06:17    EKG: Independently  reviewed. Sinus tachycardia  Assessment/Plan Principal Problem:   Acute respiratory failure (HCC) Active Problems:   Vapes nicotine containing substance   Sinus tachycardia   Shortness of breath   Assessment and Plan: * Acute respiratory failure (HCC) -Suspect patient contracted a viral upper respiratory infection -Follow-up viral respiratory panel -Continue supportive measures as ordered  Shortness of breath - Severe symptoms - Continue supplemental oxygen,  respiratory support - CTA chest negative for pulmonary embolus  Sinus tachycardia - Reactive from bronchodilators  Vapes nicotine containing substance - Nicotine patch ordered as needed   DVT prophylaxis: enoxaparin   Code Status: Full   Family Communication:   Disposition Plan: home   Consults called:   Admission status: OBS  Level of care: Telemetry Irwin Brakeman MD [facility] How to contact the [facility] Attending or Consulting provider 7A - 7P or covering provider during after hours [place], for this patient?  Check the care team in [facility] and look for a) attending/consulting TRH provider listed and b) the [facility] team listed Log into [email or web address] and use [place]'s universal password to access. If you do not have the password, please contact the hospital operator. Locate the [facility] provider you are looking for under [facility] and page to a number that you can be directly reached. If you still have difficulty reaching the provider, please page the [facility] (Director on Call) for the Hospitalists listed on amion for assistance.   If 7PM-7AM, please contact night-coverage [email or web address] Password [email or web address]  06/29/2022, 11:02 AM

## 2022-06-29 NOTE — ED Notes (Signed)
Respiratory aware of pt request for neb tx

## 2022-06-29 NOTE — ED Triage Notes (Signed)
Pt c/o shortness of breath since yesterday. Pt states it feels like she cant take a full deep breath. Pt states she has had some increased congestion the past few days. COVID about 1 month ago.

## 2022-06-29 NOTE — Assessment & Plan Note (Addendum)
-  Suspect patient contracted a viral upper respiratory infection -Follow-up viral respiratory panel: positive for rhinovirus  -presented with hypoxia 90% on RA with tachypnea -started on duonebs and IV steroids -started on empiric doxy--d/c home with 4 more days -CTA chest--neg PE, scattered GGO D/c home with prednisone x 4 days and rescue inhaler

## 2022-06-29 NOTE — ED Notes (Signed)
Pt eating lunch

## 2022-06-29 NOTE — Hospital Course (Addendum)
21 year old female with history of anxiety and depression, tobacco use, vape use, unvaccinated for covid but had covid about 6 weeks ago and has a 6 month old at home.  She reports that her husband and son have had symptoms of upper respiratory illness at home and she has started having symptoms.  Her symptoms have progressively gotten worse and severe. She has had severe coughing and wheezing spells, shortness of breath with poor exercise tolerance.  Difficulty lying recumbent.  She has not rested well the last 2 days due to cough congestion shortness of breath.  She has very short of breath with walking to the bathroom.  She has no leg pain or swelling.  She is not on oral contraceptives and no history of blood clots.  Patient has been taking Primatene Mist at home with no improvement.  She was seen in the emergency department due to severe shortness of breath symptoms and tachycardia and treated with albuterol and steroids.  CTA chest negative for PE.  Her symptoms persisted and she remained tachypneic and tachycardic and admission was requested for further management.

## 2022-06-30 DIAGNOSIS — Z28310 Unvaccinated for covid-19: Secondary | ICD-10-CM | POA: Diagnosis not present

## 2022-06-30 DIAGNOSIS — R000 Tachycardia, unspecified: Secondary | ICD-10-CM | POA: Diagnosis not present

## 2022-06-30 DIAGNOSIS — F17298 Nicotine dependence, other tobacco product, with other nicotine-induced disorders: Secondary | ICD-10-CM | POA: Diagnosis not present

## 2022-06-30 DIAGNOSIS — J9801 Acute bronchospasm: Secondary | ICD-10-CM | POA: Diagnosis not present

## 2022-06-30 DIAGNOSIS — Z720 Tobacco use: Secondary | ICD-10-CM | POA: Diagnosis not present

## 2022-06-30 DIAGNOSIS — J9601 Acute respiratory failure with hypoxia: Secondary | ICD-10-CM | POA: Diagnosis present

## 2022-06-30 DIAGNOSIS — R0602 Shortness of breath: Secondary | ICD-10-CM | POA: Diagnosis not present

## 2022-06-30 DIAGNOSIS — F32A Depression, unspecified: Secondary | ICD-10-CM | POA: Diagnosis present

## 2022-06-30 DIAGNOSIS — J9600 Acute respiratory failure, unspecified whether with hypoxia or hypercapnia: Secondary | ICD-10-CM | POA: Diagnosis not present

## 2022-06-30 DIAGNOSIS — J069 Acute upper respiratory infection, unspecified: Secondary | ICD-10-CM | POA: Diagnosis present

## 2022-06-30 DIAGNOSIS — B9789 Other viral agents as the cause of diseases classified elsewhere: Secondary | ICD-10-CM | POA: Diagnosis present

## 2022-06-30 DIAGNOSIS — Z8616 Personal history of COVID-19: Secondary | ICD-10-CM | POA: Diagnosis not present

## 2022-06-30 DIAGNOSIS — F17290 Nicotine dependence, other tobacco product, uncomplicated: Secondary | ICD-10-CM | POA: Diagnosis present

## 2022-06-30 DIAGNOSIS — F17210 Nicotine dependence, cigarettes, uncomplicated: Secondary | ICD-10-CM | POA: Diagnosis present

## 2022-06-30 DIAGNOSIS — F419 Anxiety disorder, unspecified: Secondary | ICD-10-CM | POA: Diagnosis present

## 2022-06-30 LAB — MRSA NEXT GEN BY PCR, NASAL: MRSA by PCR Next Gen: NOT DETECTED

## 2022-06-30 IMAGING — US US OB COMP +14 WK
1 series · 15 of 28 positions shown · non-contrast
Comparison: none

CLINICAL DATA: Scan for anatomy and dating. By LMP patient is 19
weeks 6 days. EDC by LMP is 12/11/2020

EXAM:
OBSTETRICAL ULTRASOUND >14 WKS

[Series 1: us ob comp + 14 wk · 15 of 86 slices shown]
[im 1/86]
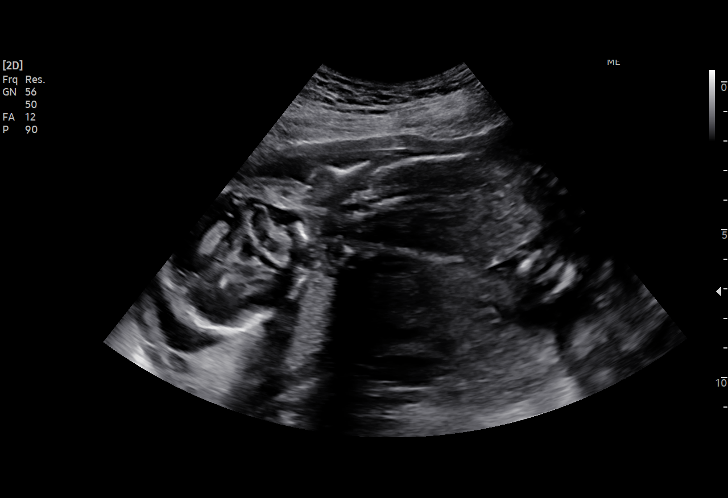
[im 7/86]
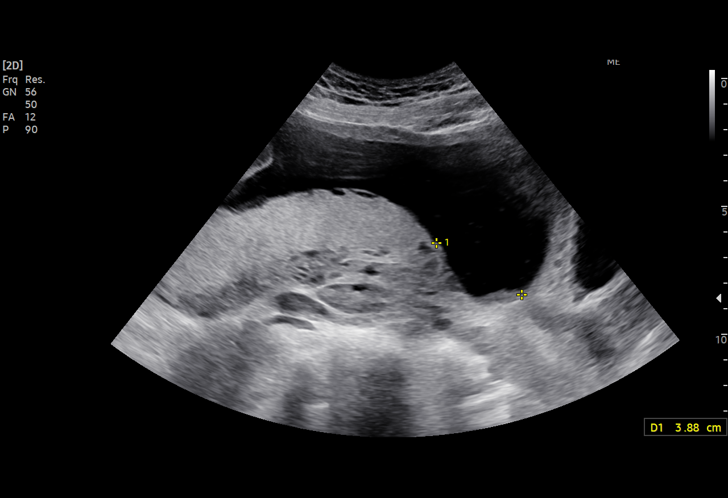
[im 13/86]
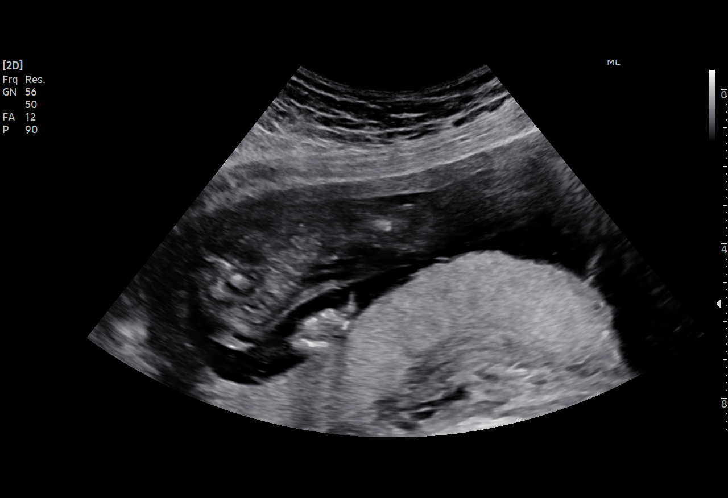
[im 19/86]
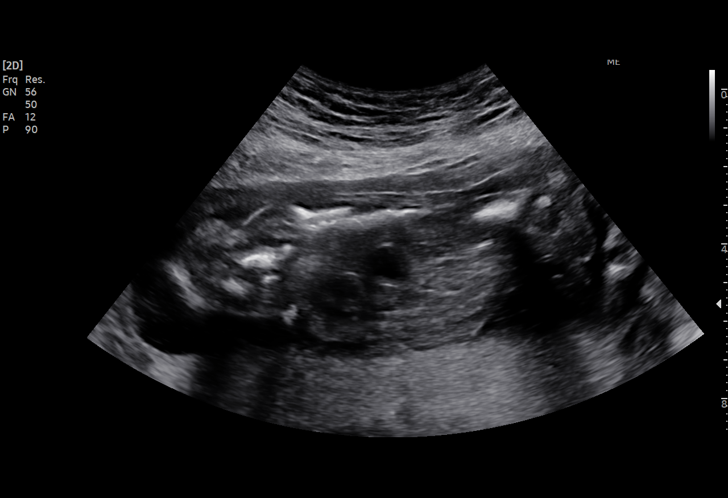
[im 26/86]
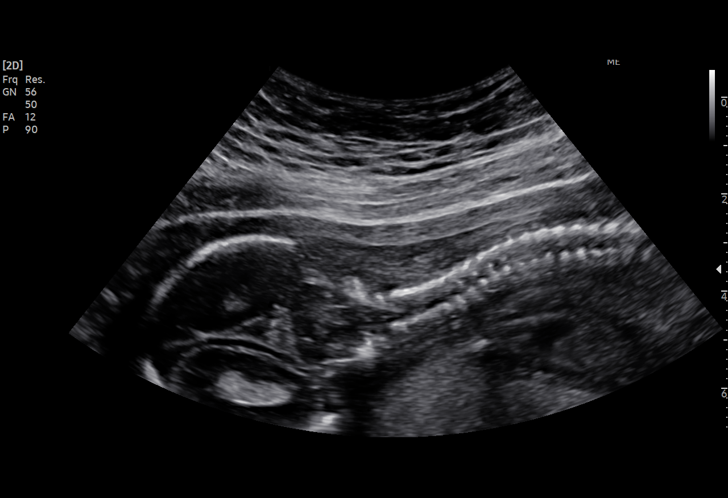
[im 32/86]
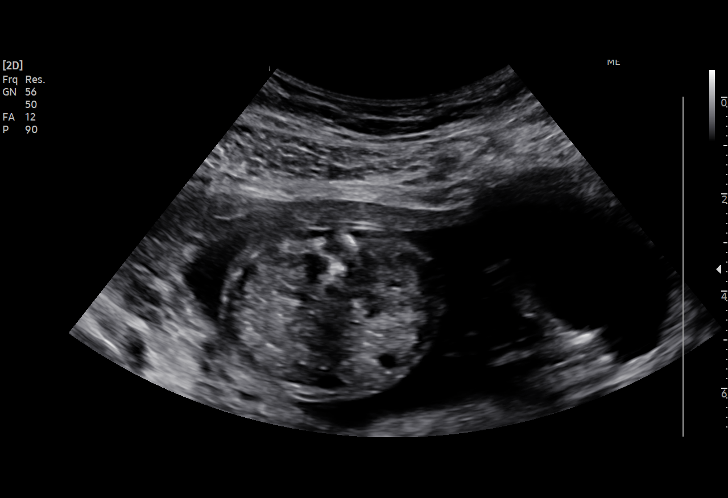
[im 38/86]
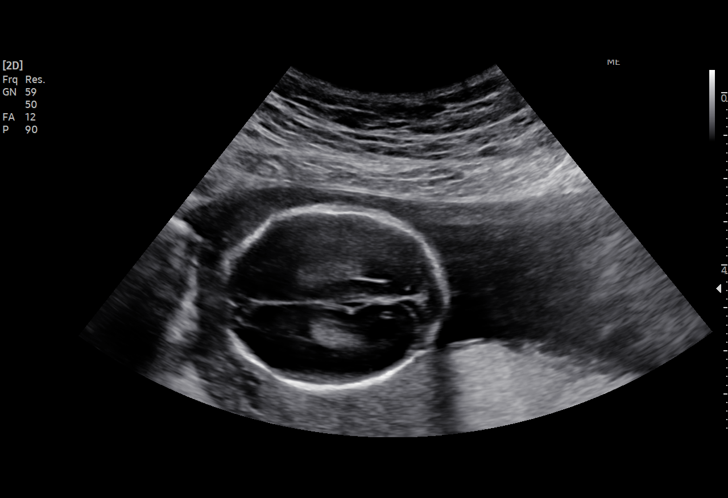
[im 45/86]
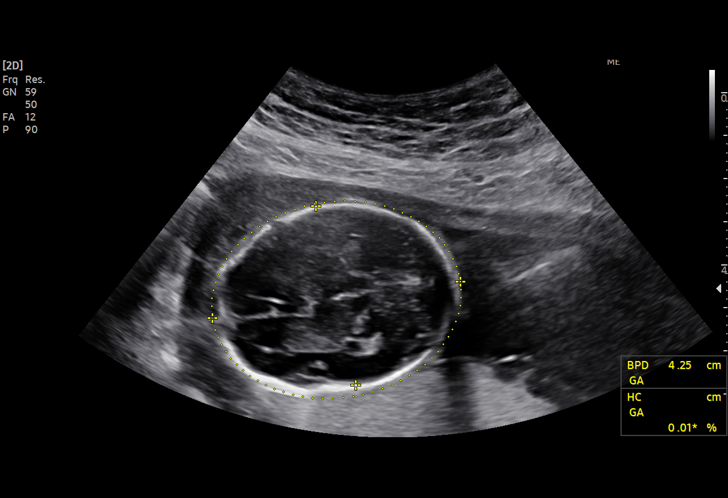
[im 48/86]
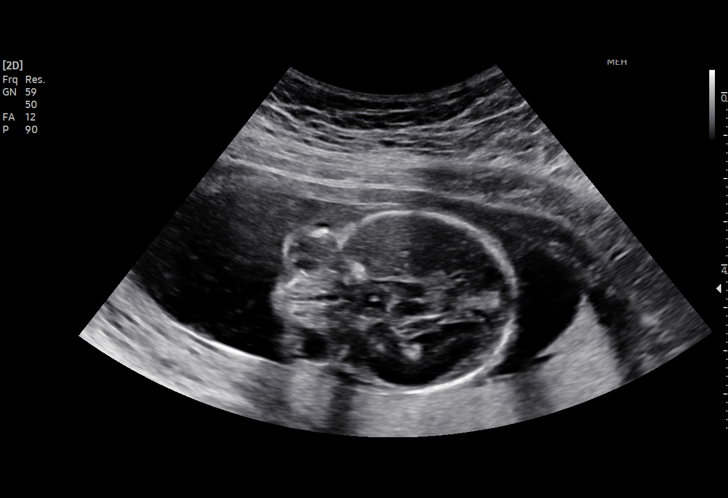
[im 54/86]
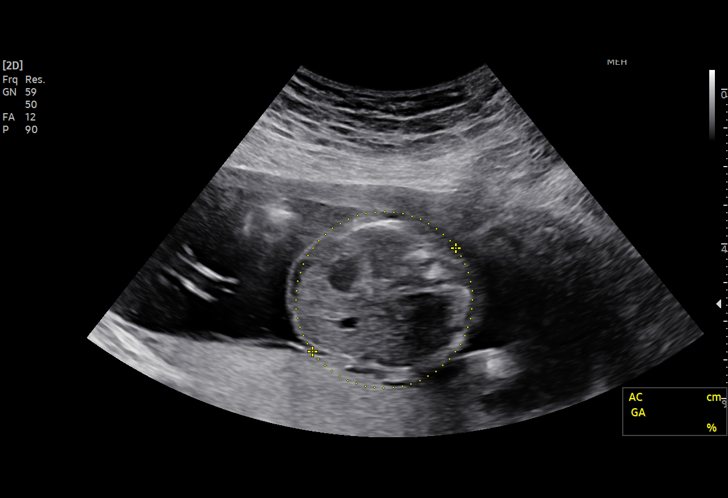
[im 60/86]
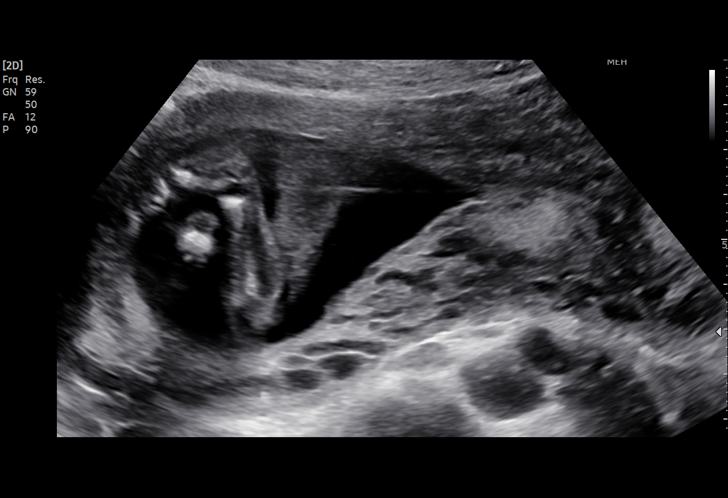
[im 67/86]
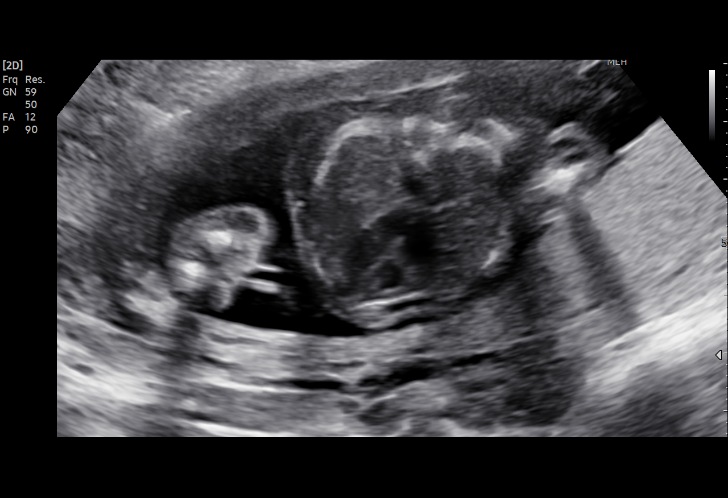
[im 73/86]
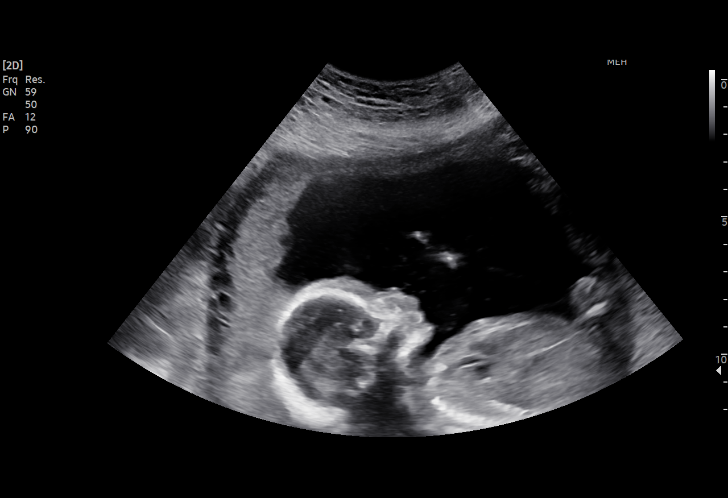
[im 79/86]
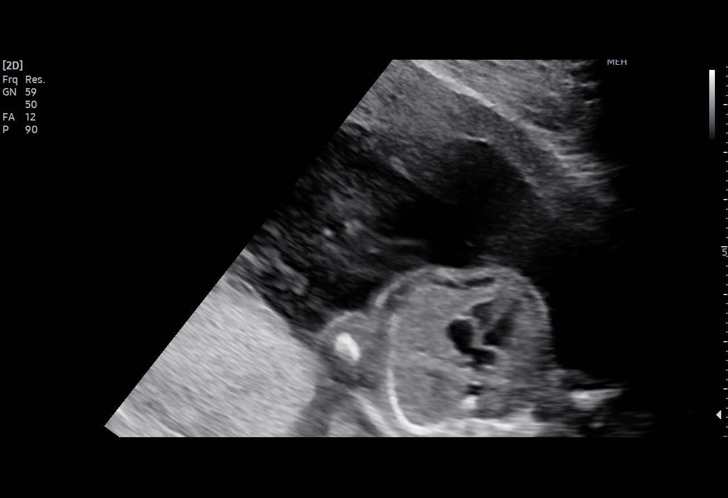
[im 86/86]
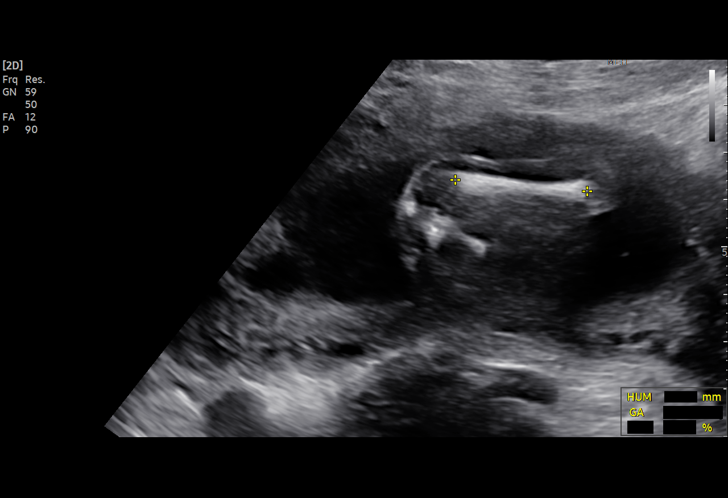

[15 of 28 positions shown; findings below may reference images not displayed]

FINDINGS: Number of Fetuses: 1

Heart Rate:  143 bpm

Movement: Present

Presentation: Breech

Previa: None

Placental Location: Posterior

Amniotic Fluid (Subjective): Normal

Amniotic Fluid (Objective):

Vertical pocket = 4.5cm

FETAL BIOMETRY

BPD: 4.3cm 18w 6d

HC:   16.2cm 19w 0d

AC:   14.3cm 19w 5d

FL:   2.5cm 18w 5d

Current Mean GA: 19w 0d US EDC: 12/11/2021

Assigned GA:  19w 6d Assigned EDC: 12/11/2021

FETAL ANATOMY

Lateral Ventricles: Appears normal

Thalami/CSP: Appears normal

Posterior Fossa:  Appears normal

Nuchal Region: Appears normal   NFT= 3 millimeters

Upper Lip: Appears normal

Spine: Appears normal

4 Chamber Heart on Left: Appears normal

LVOT: Appears normal

RVOT: Appears normal

Stomach on Left: Appears normal

3 Vessel Cord: Appears normal

Cord Insertion site: Appears normal

Kidneys: Appears normal

Bladder: Appears normal

Extremities: Appears normal

Sex: Male

Technically difficult due to: Not applicable

Maternal Findings:

Cervix:  4.9 centimeters on transabdominal evaluation
IMPRESSION: 1. Single living intrauterine fetus in breech presentation.
2. Size and dates correlate well.
3. No fetal anomalies identified on anatomic survey.

## 2022-06-30 MED ORDER — RIVAROXABAN 10 MG PO TABS
10.0000 mg | ORAL_TABLET | Freq: Every day | ORAL | Status: DC
  Administered 2022-06-30: 10 mg via ORAL
  Filled 2022-06-30 (×4): qty 1

## 2022-06-30 MED ORDER — LACTATED RINGERS IV SOLN: INTRAVENOUS | Status: DC

## 2022-06-30 MED ORDER — VITAMIN C 500 MG PO TABS
500.0000 mg | ORAL_TABLET | Freq: Two times a day (BID) | ORAL | Status: DC
  Administered 2022-06-30 – 2022-07-01 (×3): 500 mg via ORAL
  Filled 2022-06-30 (×3): qty 1

## 2022-06-30 NOTE — Progress Notes (Signed)
  Transition of Care [facility]) Screening Note   Patient Details  Name: Danielle Sheppard Date of Birth: 2001/03/08   Transition of Care [facility]) CM/SW Contact:    Ihor Gully, LCSW Phone Number: 06/30/2022, 2:50 PM    Transition of Care Department [facility]) has reviewed patient and no TOC needs have been identified at this time. We will continue to monitor patient advancement through interdisciplinary progression rounds. If new patient transition needs arise, please place a TOC consult.

## 2022-06-30 NOTE — Progress Notes (Addendum)
PROGRESS NOTE   Danielle Sheppard  EVO:[id number] DOB: January 14, 2001 DOA: 06/29/2022 PCP: Marisue Ivan, MD   Chief Complaint  Patient presents with   Shortness of Breath   Level of care: Med-Surg  Brief Admission History:  21 year old female with history of anxiety and depression, tobacco use, vape use, unvaccinated for covid but had covid about 6 weeks ago and has a 68 month old at home.  She reports that her husband and son have had symptoms of upper respiratory illness at home and she has started having symptoms.  Her symptoms have progressively gotten worse and severe. She has had severe coughing and wheezing spells, shortness of breath with poor exercise tolerance.  Difficulty lying recumbent.  She has not rested well the last 2 days due to cough congestion shortness of breath.  She has very short of breath with walking to the bathroom.  She has no leg pain or swelling.  She is not on oral contraceptives and no history of blood clots.  Patient has been taking Primatene Mist at home with no improvement.  She was seen in the emergency department due to severe shortness of breath symptoms and tachycardia and treated with albuterol and steroids.  CTA chest negative for PE.  Her symptoms persisted and she remained tachypneic and tachycardic and admission was requested for further management.   Assessment and Plan: * Acute respiratory failure due to viral URI -Suspect patient contracted a viral upper respiratory infection -Follow-up viral respiratory panel: positive for rhinovirus  -Continue supportive measures as ordered -Pt improved but not back to baseline, still very SOB with minimal activity -continue hospital care, transfer to med surg today, likely home tomorrow   Shortness of breath - Severe symptoms on presentation  - Continue supplemental oxygen, respiratory support - CTA chest negative for pulmonary embolus - still with severe DOE, continue current treatments as she is  improving, not ready for home today, keep in hospital 1 more day and home tomorrow   Sinus tachycardia - physiologic reactive from respiratory distress and bronchodilators - I'm comfortable stopping cardiac monitor now that we have monitored for 24 hours - transfer to med surg - hopeful to get home tomorrow   Vapes nicotine containing substance - Nicotine patch ordered as needed   DVT prophylaxis: enoxaparin (refused) rivaroxaban 10 mg Code Status: full  Family Communication: husband updated bedside 10/3   Consultants:   Procedures:   Antimicrobials:  Doxycycline 10/2>>  Subjective: Pt gets very SOB with exertion, ambulation, less coughing overnight but severe coughing this morning and loud wheezing, speaking in short sentences, not as bad today as yesterday  Objective: Vitals:   06/30/22 0800 06/30/22 0900 06/30/22 1000 06/30/22 1051  BP: (!) 123/99 123/84 102/72   Pulse: 86 100 94   Resp: 20 (!) 21 (!) 21   Temp:      TempSrc:      SpO2:  95% 99% 98%  Weight:      Height:       No intake or output data in the 24 hours ending 06/30/22 1056 Filed Weights   06/29/22 0534 06/29/22 1725 06/30/22 0350  Weight: 72.6 kg 89.5 kg 90.9 kg   Examination:  General exam: Appears calm and comfortable  Respiratory system: less tachypnea today but still 20-24 bibasilar exp wheezes are stubborn, overall improved from 10/2 Cardiovascular system: tachycardic, normal S1 & S2 heard. No JVD, murmurs, rubs, gallops or clicks. No pedal edema. Gastrointestinal system: Abdomen is nondistended, soft and nontender. No organomegaly  or masses felt. Normal bowel sounds heard. Central nervous system: Alert and oriented. No focal neurological deficits. Extremities: Symmetric 5 x 5 power. Skin: No rashes, lesions or ulcers. Psychiatry: Judgement and insight appear normal. Mood & affect appropriate.   Data Reviewed: I have personally reviewed following labs and imaging studies  CBC: Recent Labs   Lab 06/29/22 0600  WBC 13.9*  HGB 14.7  HCT 43.3  MCV 82.6  PLT 854    Basic Metabolic Panel: Recent Labs  Lab 06/29/22 0600  NA 139  K 3.3*  CL 109  CO2 21*  GLUCOSE 94  BUN 12  CREATININE 0.80  CALCIUM 9.4    CBG: No results for input(s): "GLUCAP" in the last 168 hours.  Recent Results (from the past 240 hour(s))  Respiratory (~20 pathogens) panel by PCR     Status: Abnormal   Collection Time: 06/29/22 10:04 AM   Specimen: Nasopharyngeal Swab; Respiratory  Result Value Ref Range Status   Adenovirus NOT DETECTED NOT DETECTED Final   Coronavirus 229E NOT DETECTED NOT DETECTED Final    Comment: (NOTE) The Coronavirus on the Respiratory Panel, DOES NOT test for the novel  Coronavirus (2019 nCoV)    Coronavirus HKU1 NOT DETECTED NOT DETECTED Final   Coronavirus NL63 NOT DETECTED NOT DETECTED Final   Coronavirus OC43 NOT DETECTED NOT DETECTED Final   Metapneumovirus NOT DETECTED NOT DETECTED Final   Rhinovirus / Enterovirus DETECTED (A) NOT DETECTED Final   Influenza A NOT DETECTED NOT DETECTED Final   Influenza B NOT DETECTED NOT DETECTED Final   Parainfluenza Virus 1 NOT DETECTED NOT DETECTED Final   Parainfluenza Virus 2 NOT DETECTED NOT DETECTED Final   Parainfluenza Virus 3 NOT DETECTED NOT DETECTED Final   Parainfluenza Virus 4 NOT DETECTED NOT DETECTED Final   Respiratory Syncytial Virus NOT DETECTED NOT DETECTED Final   Bordetella pertussis NOT DETECTED NOT DETECTED Final   Bordetella Parapertussis NOT DETECTED NOT DETECTED Final   Chlamydophila pneumoniae NOT DETECTED NOT DETECTED Final   Mycoplasma pneumoniae NOT DETECTED NOT DETECTED Final    Comment: Performed at [facility], [address] [place], [place] [address]  MRSA Next Gen by PCR, Nasal     Status: None   Collection Time: 06/29/22  5:29 PM   Specimen: Nasal Mucosa; Nasal Swab  Result Value Ref Range Status   MRSA by PCR Next Gen NOT DETECTED NOT DETECTED Final    Comment:  (NOTE) The GeneXpert MRSA Assay (FDA approved for NASAL specimens only), is one component of a comprehensive MRSA colonization surveillance program. It is not intended to diagnose MRSA infection nor to guide or monitor treatment for MRSA infections. Test performance is not FDA approved in patients less than 53 years old. Performed at [facility], [address], [place], [place] [address]      Radiology Studies: CT Angio Chest Pulmonary Embolism (PE) W or WO Contrast  Result Date: 06/29/2022 CLINICAL DATA:  Shortness of breath for 2 days, worsening EXAM: CT ANGIOGRAPHY CHEST WITH CONTRAST TECHNIQUE: Multidetector CT imaging of the chest was performed using the standard protocol during bolus administration of intravenous contrast. Multiplanar CT image reconstructions and MIPs were obtained to evaluate the vascular anatomy. RADIATION DOSE REDUCTION: This exam was performed according to the departmental dose-optimization program which includes automated exposure control, adjustment of the mA and/or kV according to patient size and/or use of iterative reconstruction technique. CONTRAST:  67mL OMNIPAQUE IOHEXOL 350 MG/ML SOLN COMPARISON:  01/04/2016 FINDINGS: Cardiovascular:  Satisfactory opacification of the pulmonary arteries to the segmental level. No evidence of pulmonary embolism. Normal heart size. No pericardial effusion. Mediastinum/Nodes: Negative for adenopathy or mass Lungs/Pleura: Few patchy areas of ground-glass opacity in the upper lobes and right lower lobe, roughly the size of secondary pulmonary lobules. No edema, effusion, or pneumothorax Upper Abdomen: Unremarkable Musculoskeletal: Unremarkable Review of the MIP images confirms the above findings. IMPRESSION: 1. Negative for pulmonary embolism. 2. Few areas of ground-glass opacity that could be early pneumonitis or atelectasis. Electronically Signed   By: Tiburcio Pea M.D.   On: 06/29/2022 07:46   DG Chest Portable 1 View  Result  Date: 06/29/2022 CLINICAL DATA:  Shortness of breath. EXAM: PORTABLE CHEST 1 VIEW COMPARISON:  None Available. FINDINGS: 0609 hours. The lungs are clear without focal pneumonia, edema, pneumothorax or pleural effusion. The cardiopericardial silhouette is within normal limits for size. The visualized bony structures of the thorax are unremarkable. Telemetry leads overlie the chest. IMPRESSION: No active disease. Electronically Signed   By: Kennith Center M.D.   On: 06/29/2022 06:17    Scheduled Meds:  Chlorhexidine Gluconate Cloth  6 each Topical Q0600   dextromethorphan  30 mg Oral BID   doxycycline  100 mg Oral Q12H   enoxaparin (LOVENOX) injection  40 mg Subcutaneous Q24H   guaiFENesin  1,200 mg Oral BID   ipratropium-albuterol  3 mL Nebulization TID   methylPREDNISolone (SOLU-MEDROL) injection  40 mg Intravenous Q12H   Continuous Infusions:  lactated ringers       LOS: 0 days   Time spent: 35 mins  Toshiko Kemler Laural Benes, MD How to contact the [facility] Attending or Consulting provider 7A - 7P or covering provider during after hours 7P -7A, for this patient?  Check the care team in [facility] and look for a) attending/consulting TRH provider listed and b) the [facility] team listed Log into [email or web address] and use [place]'s universal password to access. If you do not have the password, please contact the hospital operator. Locate the [facility] provider you are looking for under [facility] and page to a number that you can be directly reached. If you still have difficulty reaching the provider, please page the [facility] (Director on Call) for the Hospitalists listed on amion for assistance.  06/30/2022, 10:56 AM

## 2022-07-01 DIAGNOSIS — Z720 Tobacco use: Secondary | ICD-10-CM

## 2022-07-01 DIAGNOSIS — J9601 Acute respiratory failure with hypoxia: Secondary | ICD-10-CM

## 2022-07-01 DIAGNOSIS — J9801 Acute bronchospasm: Secondary | ICD-10-CM

## 2022-07-01 DIAGNOSIS — R000 Tachycardia, unspecified: Secondary | ICD-10-CM

## 2022-07-01 MED ORDER — MENTHOL 3 MG MT LOZG
1.0000 | LOZENGE | OROMUCOSAL | Status: DC | PRN
  Filled 2022-07-01 (×2): qty 9

## 2022-07-01 MED ORDER — ALBUTEROL SULFATE (2.5 MG/3ML) 0.083% IN NEBU: 2.5000 mg | INHALATION_SOLUTION | RESPIRATORY_TRACT | Status: DC | PRN

## 2022-07-01 MED ORDER — PREDNISONE 50 MG PO TABS: 50.0000 mg | ORAL_TABLET | Freq: Every day | ORAL | 0 refills | Status: AC

## 2022-07-01 MED ORDER — DOXYCYCLINE HYCLATE 100 MG PO TABS: 100.0000 mg | ORAL_TABLET | Freq: Two times a day (BID) | ORAL | 0 refills | Status: DC

## 2022-07-01 MED ORDER — ALBUTEROL SULFATE HFA 108 (90 BASE) MCG/ACT IN AERS: 2.0000 | INHALATION_SPRAY | RESPIRATORY_TRACT | 1 refills | Status: AC | PRN

## 2022-07-01 MED ORDER — METHYLPREDNISOLONE SODIUM SUCC 40 MG IJ SOLR
40.0000 mg | Freq: Once | INTRAMUSCULAR | Status: AC
  Administered 2022-07-01: 40 mg via INTRAVENOUS
  Filled 2022-07-01: qty 1

## 2022-07-01 MED ORDER — PREDNISONE 20 MG PO TABS: 50.0000 mg | ORAL_TABLET | Freq: Every day | ORAL | Status: DC

## 2022-07-01 MED ORDER — CHLORHEXIDINE GLUCONATE CLOTH 2 % EX PADS
6.0000 | MEDICATED_PAD | Freq: Every day | CUTANEOUS | Status: DC
  Administered 2022-07-01: 6 via TOPICAL

## 2022-07-01 NOTE — Progress Notes (Signed)
Patient given discharge instructions and verbalized understanding. Spouse at bedside. Patient discharged to home.

## 2022-07-01 NOTE — Discharge Summary (Addendum)
Physician Discharge Summary   Patient: Danielle Sheppard MRN: [id number] DOB: Dec 25, 2000  Admit date:     06/29/2022  Discharge date: 07/01/22  Discharge Physician: Onalee Hua Okley Magnussen   PCP: Marisue Ivan, MD   Recommendations at discharge:   Please follow up with primary care provider within 1-2 weeks  Please repeat BMP and CBC in one week    Hospital Course: 21 year old female with history of anxiety and depression, tobacco use, vape use, unvaccinated for covid but had covid about 6 weeks ago and has a 32 month old at home.  She reports that her husband and son have had symptoms of upper respiratory illness at home and she has started having symptoms.  Her symptoms have progressively gotten worse and severe. She has had severe coughing and wheezing spells, shortness of breath with poor exercise tolerance.  Difficulty lying recumbent.  She has not rested well the last 2 days due to cough congestion shortness of breath.  She has very short of breath with walking to the bathroom.  She has no leg pain or swelling.  She is not on oral contraceptives and no history of blood clots.  Patient has been taking Primatene Mist at home with no improvement.  She was seen in the emergency department due to severe shortness of breath symptoms and tachycardia and treated with albuterol and steroids.  CTA chest negative for PE.  Her symptoms persisted and she remained tachypneic and tachycardic and admission was requested for further management.  Assessment and Plan: * Acute respiratory failure with hypoxia (HCC) -Suspect patient contracted a viral upper respiratory infection -Follow-up viral respiratory panel: positive for rhinovirus  -presented with hypoxia 90% on RA with tachypnea -started on duonebs and IV steroids -started on empiric doxy--d/c home with 4 more days -CTA chest--neg PE, scattered GGO D/c home with prednisone x 4 days and rescue inhaler  Shortness of breath - Severe symptoms on presentation   - Continue supplemental oxygen, respiratory support - CTA chest negative for pulmonary embolus - improved  Sinus tachycardia - physiologic reactive from respiratory distress and bronchodilators - I'm comfortable stopping cardiac monitor now that we have monitored for 24 hours -improved  Vapes nicotine containing substance - Nicotine patch ordered as needed         Consultants: none Procedures performed: none  Disposition: Home Diet recommendation:  Regular diet DISCHARGE MEDICATION: Allergies as of 07/01/2022   No Known Allergies      Medication List     STOP taking these medications    PRIMATENE MIST IN       TAKE these medications    albuterol 108 (90 Base) MCG/ACT inhaler Commonly known as: VENTOLIN HFA Inhale 2 puffs into the lungs every 4 (four) hours as needed for wheezing or shortness of breath.   doxycycline 100 MG tablet Commonly known as: VIBRA-TABS Take 1 tablet (100 mg total) by mouth every 12 (twelve) hours. Notes to patient: This is an antibiotic. Take evening dose tonight 10/4.   predniSONE 50 MG tablet Commonly known as: DELTASONE Take 1 tablet (50 mg total) by mouth daily with breakfast. Start taking on: July 02, 2022        Discharge Exam: Ceasar Mons Weights   06/29/22 0534 06/29/22 1725 06/30/22 0350  Weight: 72.6 kg 89.5 kg 90.9 kg   HEENT:  [place]/AT, No thrush, no icterus CV:  RRR, no rub, no S3, no S4 Lung:  bibasilar rales.  Minimal bibasilar wheeze Abd:  soft/+BS, NT Ext:  No edema, no  lymphangitis, no synovitis, no rash   Condition at discharge: stable  The results of significant diagnostics from this hospitalization (including imaging, microbiology, ancillary and laboratory) are listed below for reference.   Imaging Studies: CT Angio Chest Pulmonary Embolism (PE) W or WO Contrast  Result Date: 06/29/2022 CLINICAL DATA:  Shortness of breath for 2 days, worsening EXAM: CT ANGIOGRAPHY CHEST WITH CONTRAST TECHNIQUE:  Multidetector CT imaging of the chest was performed using the standard protocol during bolus administration of intravenous contrast. Multiplanar CT image reconstructions and MIPs were obtained to evaluate the vascular anatomy. RADIATION DOSE REDUCTION: This exam was performed according to the departmental dose-optimization program which includes automated exposure control, adjustment of the mA and/or kV according to patient size and/or use of iterative reconstruction technique. CONTRAST:  34mL OMNIPAQUE IOHEXOL 350 MG/ML SOLN COMPARISON:  01/04/2016 FINDINGS: Cardiovascular: Satisfactory opacification of the pulmonary arteries to the segmental level. No evidence of pulmonary embolism. Normal heart size. No pericardial effusion. Mediastinum/Nodes: Negative for adenopathy or mass Lungs/Pleura: Few patchy areas of ground-glass opacity in the upper lobes and right lower lobe, roughly the size of secondary pulmonary lobules. No edema, effusion, or pneumothorax Upper Abdomen: Unremarkable Musculoskeletal: Unremarkable Review of the MIP images confirms the above findings. IMPRESSION: 1. Negative for pulmonary embolism. 2. Few areas of ground-glass opacity that could be early pneumonitis or atelectasis. Electronically Signed   By: Jorje Guild M.D.   On: 06/29/2022 07:46   DG Chest Portable 1 View  Result Date: 06/29/2022 CLINICAL DATA:  Shortness of breath. EXAM: PORTABLE CHEST 1 VIEW COMPARISON:  None Available. FINDINGS: 0609 hours. The lungs are clear without focal pneumonia, edema, pneumothorax or pleural effusion. The cardiopericardial silhouette is within normal limits for size. The visualized bony structures of the thorax are unremarkable. Telemetry leads overlie the chest. IMPRESSION: No active disease. Electronically Signed   By: Misty Stanley M.D.   On: 06/29/2022 06:17    Microbiology: Results for orders placed or performed during the hospital encounter of 06/29/22  Respiratory (~20 pathogens) panel  by PCR     Status: Abnormal   Collection Time: 06/29/22 10:04 AM   Specimen: Nasopharyngeal Swab; Respiratory  Result Value Ref Range Status   Adenovirus NOT DETECTED NOT DETECTED Final   Coronavirus 229E NOT DETECTED NOT DETECTED Final    Comment: (NOTE) The Coronavirus on the Respiratory Panel, DOES NOT test for the novel  Coronavirus (2019 nCoV)    Coronavirus HKU1 NOT DETECTED NOT DETECTED Final   Coronavirus NL63 NOT DETECTED NOT DETECTED Final   Coronavirus OC43 NOT DETECTED NOT DETECTED Final   Metapneumovirus NOT DETECTED NOT DETECTED Final   Rhinovirus / Enterovirus DETECTED (A) NOT DETECTED Final   Influenza A NOT DETECTED NOT DETECTED Final   Influenza B NOT DETECTED NOT DETECTED Final   Parainfluenza Virus 1 NOT DETECTED NOT DETECTED Final   Parainfluenza Virus 2 NOT DETECTED NOT DETECTED Final   Parainfluenza Virus 3 NOT DETECTED NOT DETECTED Final   Parainfluenza Virus 4 NOT DETECTED NOT DETECTED Final   Respiratory Syncytial Virus NOT DETECTED NOT DETECTED Final   Bordetella pertussis NOT DETECTED NOT DETECTED Final   Bordetella Parapertussis NOT DETECTED NOT DETECTED Final   Chlamydophila pneumoniae NOT DETECTED NOT DETECTED Final   Mycoplasma pneumoniae NOT DETECTED NOT DETECTED Final    Comment: Performed at [facility], [address], [place], [place] [address]  MRSA Next Gen by PCR, Nasal     Status: None   Collection Time: 06/29/22  5:29 PM   Specimen: Nasal Mucosa; Nasal Swab  Result Value Ref Range Status   MRSA by PCR Next Gen NOT DETECTED NOT DETECTED Final    Comment: (NOTE) The GeneXpert MRSA Assay (FDA approved for NASAL specimens only), is one component of a comprehensive MRSA colonization surveillance program. It is not intended to diagnose MRSA infection nor to guide or monitor treatment for MRSA infections. Test performance is not FDA approved in patients less than 68 years old. Performed at [facility], [address], [place],  [place] [address]     Labs: CBC: Recent Labs  Lab 06/29/22 0600  WBC 13.9*  HGB 14.7  HCT 43.3  MCV 82.6  PLT 274   Basic Metabolic Panel: Recent Labs  Lab 06/29/22 0600  NA 139  K 3.3*  CL 109  CO2 21*  GLUCOSE 94  BUN 12  CREATININE 0.80  CALCIUM 9.4   Liver Function Tests: No results for input(s): "AST", "ALT", "ALKPHOS", "BILITOT", "PROT", "ALBUMIN" in the last 168 hours. CBG: No results for input(s): "GLUCAP" in the last 168 hours.  Discharge time spent: greater than 30 minutes.  Signed: Catarina Hartshorn, MD [facility] 07/01/2022

## 2022-08-14 ENCOUNTER — Telehealth: Payer: Self-pay

## 2022-08-14 ENCOUNTER — Other Ambulatory Visit: Payer: Self-pay

## 2022-08-14 ENCOUNTER — Other Ambulatory Visit: Payer: Medicaid Other

## 2022-08-14 DIAGNOSIS — Z3200 Encounter for pregnancy test, result unknown: Secondary | ICD-10-CM

## 2022-08-14 NOTE — Telephone Encounter (Signed)
Pt calling stating she had some light pink spotting when wiping this morning. Had +UPT earlier this week. Also had some dark red when wiping just now before she called. Also having some mild cramping. Advised pt  to come in for labs today. And also coming in Monday so we can track levels. Pt coming at 11:00 today  put order under midwife on call

## 2022-08-15 LAB — BETA HCG QUANT (REF LAB): hCG Quant: 7 m[IU]/mL

## 2022-08-17 ENCOUNTER — Telehealth: Payer: Self-pay | Admitting: Obstetrics

## 2022-08-17 ENCOUNTER — Other Ambulatory Visit: Payer: Medicaid Other

## 2022-08-17 NOTE — Telephone Encounter (Signed)
Reached out to pt to reschedule labs that were scheduled on 11/20 at 11:00.  Left message for pt to call back to reschedule.

## 2022-08-18 ENCOUNTER — Encounter: Payer: Self-pay | Admitting: Licensed Practical Nurse

## 2022-08-18 ENCOUNTER — Encounter: Payer: Self-pay | Admitting: Obstetrics

## 2022-08-18 NOTE — Telephone Encounter (Signed)
Reached out to pt (2x) to reschedule labs that were scheduled on 11/20 at 11:00.  Left message for pt to call back to reschedule.  Will send a MyChart letter.

## 2023-02-21 ENCOUNTER — Inpatient Hospital Stay (HOSPITAL_COMMUNITY)
Admission: AD | Admit: 2023-02-21 | Discharge: 2023-02-21 | Disposition: A | Discharge status: Home / Self Care | Payer: BC Managed Care – PPO | Attending: Obstetrics & Gynecology | Admitting: Obstetrics & Gynecology

## 2023-02-21 ENCOUNTER — Encounter (HOSPITAL_COMMUNITY): Payer: Self-pay | Admitting: *Deleted

## 2023-02-21 DIAGNOSIS — Z3A11 11 weeks gestation of pregnancy: Secondary | ICD-10-CM | POA: Diagnosis not present

## 2023-02-21 DIAGNOSIS — O0932 Supervision of pregnancy with insufficient antenatal care, second trimester: Secondary | ICD-10-CM

## 2023-02-21 DIAGNOSIS — O039 Complete or unspecified spontaneous abortion without complication: Secondary | ICD-10-CM | POA: Insufficient documentation

## 2023-02-21 LAB — CBC
HCT: 38.7 % (ref 36.0–46.0)
Hemoglobin: 12.9 g/dL (ref 12.0–15.0)
MCH: 28.7 pg (ref 26.0–34.0)
MCHC: 33.3 g/dL (ref 30.0–36.0)
MCV: 86 fL (ref 80.0–100.0)
Platelets: 272 10*3/uL (ref 150–400)
RBC: 4.5 MIL/uL (ref 3.87–5.11)
RDW: 13.8 % (ref 11.5–15.5)
WBC: 14 10*3/uL — ABNORMAL HIGH (ref 4.0–10.5)
nRBC: 0 % (ref 0.0–0.2)

## 2023-02-21 LAB — WET PREP, GENITAL
Sperm: NONE SEEN
Trich, Wet Prep: NONE SEEN
WBC, Wet Prep HPF POC: <10 (ref <10)
Yeast Wet Prep HPF POC: NONE SEEN

## 2023-02-21 LAB — HEPATITIS C ANTIBODY: HCV Ab: NONREACTIVE

## 2023-02-21 LAB — HEPATITIS B SURFACE ANTIGEN: Hepatitis B Surface Ag: NONREACTIVE

## 2023-02-21 LAB — HIV ANTIBODY (ROUTINE TESTING W REFLEX): HIV Screen 4th Generation wRfx: NONREACTIVE

## 2023-02-21 LAB — RPR: RPR Ser Ql: NONREACTIVE

## 2023-02-21 MED ORDER — IBUPROFEN 600 MG PO TABS: 600.0000 mg | ORAL_TABLET | Freq: Four times a day (QID) | ORAL | 3 refills | Status: AC | PRN

## 2023-02-21 MED ORDER — ACETAMINOPHEN 325 MG PO TABS: 650.0000 mg | ORAL_TABLET | ORAL | 0 refills | Status: DC | PRN

## 2023-02-21 NOTE — MAU Note (Signed)
Pericare given and new pad under pt.

## 2023-02-21 NOTE — MAU Note (Signed)
PT up to BR and voided without difficulty. Ambulates well

## 2023-02-21 NOTE — MAU Provider Note (Cosign Needed Addendum)
History     [id number]  Arrival date and time: 02/21/23 7829    Chief Complaint  Patient presents with   Miscarriage     HPI Eurydice Wormuth is a 22 y.o. G3P2011 at Unknown gestational age who presents via EMS for miscarriage.  Patient states she woke up this morning with intense abdominal pain & shortly after delivered a baby. States feet came out first. Since then has not had abdominal pain.  Reports she was diagnosed with a miscarriage in December. Became pregnant again, unsure when. Had a heavy bleeding episode the end of March and thought she had a miscarriage at that time. Did not seek care to confirm miscarriage.     OB History     Gravida  4   Para  2   Term  2   Preterm      AB  1   Living  2      SAB  1   IAB      Ectopic      Multiple      Live Births  2           Past Medical History:  Diagnosis Date   Anxiety    Depression     Past Surgical History:  Procedure Laterality Date   CESAREAN SECTION N/A     History reviewed. No pertinent family history.  Social History   Socioeconomic History   Marital status: Married    Spouse name: Not on file   Number of children: Not on file   Years of education: Not on file   Highest education level: Not on file  Occupational History   Not on file  Tobacco Use   Smoking status: Some Days   Smokeless tobacco: Never  Vaping Use   Vaping Use: Every day  Substance and Sexual Activity   Alcohol use: No   Drug use: Never   Sexual activity: Yes    Birth control/protection: None  Other Topics Concern   Not on file  Social History Narrative   Not on file   Social Determinants of Health   Financial Resource Strain: Not on file  Food Insecurity: Not on file  Transportation Needs: Not on file  Physical Activity: Not on file  Stress: Not on file  Social Connections: Not on file  Intimate Partner Violence: Not on file    No Known Allergies  No current facility-administered  medications on file prior to encounter.   Current Outpatient Medications on File Prior to Encounter  Medication Sig Dispense Refill   albuterol (VENTOLIN HFA) 108 (90 Base) MCG/ACT inhaler Inhale 2 puffs into the lungs every 4 (four) hours as needed for wheezing or shortness of breath. 6.7 g 1   escitalopram (LEXAPRO) 5 MG tablet Take by mouth. (Patient not taking: Reported on 02/21/2023)     predniSONE (DELTASONE) 50 MG tablet Take 1 tablet (50 mg total) by mouth daily with breakfast. 4 tablet 0     ROS Pertinent positives and negative per HPI, all others reviewed and negative  Physical Exam   BP 123/77   Pulse 85   Temp 98.2 F (36.8 C)   Resp 18   LMP  (LMP Unknown)   SpO2 99%   Patient Vitals for the past 24 hrs:  BP Temp Pulse Resp SpO2  02/21/23 0700 123/77 -- 85 -- --  02/21/23 0619 137/82 98.2 F (36.8 C) 95 18 99 %    Physical Exam Vitals and nursing note  reviewed.  Constitutional:      General: She is not in acute distress.    Appearance: She is well-developed. She is not ill-appearing.  HENT:     Head: Normocephalic and atraumatic.  Eyes:     General: No scleral icterus.       Right eye: No discharge.        Left eye: No discharge.     Conjunctiva/sclera: Conjunctivae normal.  Pulmonary:     Effort: Pulmonary effort is normal. No respiratory distress.  Abdominal:     General: Abdomen is flat.     Palpations: Abdomen is soft.     Tenderness: There is no abdominal tenderness.     Comments: Fundus 3 below umbilicus, firm  Genitourinary:    Comments: Perineum intact. Small amount of dark red blood.  Neurological:     General: No focal deficit present.     Mental Status: She is alert.  Psychiatric:        Mood and Affect: Mood normal.        Behavior: Behavior normal.        Labs Results for orders placed or performed during the hospital encounter of 02/21/23 (from the past 24 hour(s))  Wet prep, genital     Status: Abnormal   Collection Time:  02/21/23  6:58 AM   Specimen: Vaginal  Result Value Ref Range   Yeast Wet Prep HPF POC NONE SEEN NONE SEEN   Trich, Wet Prep NONE SEEN NONE SEEN   Clue Cells Wet Prep HPF POC PRESENT (A) NONE SEEN   WBC, Wet Prep HPF POC <10 <10   Sperm NONE SEEN   CBC     Status: Abnormal   Collection Time: 02/21/23  7:04 AM  Result Value Ref Range   WBC 14.0 (H) 4.0 - 10.5 K/uL   RBC 4.50 3.87 - 5.11 MIL/uL   Hemoglobin 12.9 12.0 - 15.0 g/dL   HCT 16.1 09.6 - 04.5 %   MCV 86.0 80.0 - 100.0 fL   MCH 28.7 26.0 - 34.0 pg   MCHC 33.3 30.0 - 36.0 g/dL   RDW 40.9 81.1 - 91.4 %   Platelets 272 150 - 400 K/uL   nRBC 0.0 0.0 - 0.2 %    Imaging No results found.  MAU Course  Procedures Lab Orders         Wet prep, genital         CBC         RPR         HIV Antibody (routine testing w rflx)         Rubella screen         Hepatitis B surface antigen         Hemoglobin A1c         Hepatitis C antibody     No orders of the defined types were placed in this encounter.  Imaging Orders  No imaging studies ordered today    MDM Patient presents with fetus that appears to be ~23 weeks. No signs of life. Attached to placenta which appears to be intact.  Labs ordered per consult with Dr. Debroah Loop  Care turned over to [facility] Judeth Horn, NP 02/21/2023 7:52 AM   Results for orders placed or performed during the hospital encounter of 02/21/23 (from the past 24 hour(s))  Wet prep, genital     Status: Abnormal   Collection Time: 02/21/23  6:58 AM   Specimen: Vaginal  Result Value Ref Range   Yeast Wet Prep HPF POC NONE SEEN NONE SEEN   Trich, Wet Prep NONE SEEN NONE SEEN   Clue Cells Wet Prep HPF POC PRESENT (A) NONE SEEN   WBC, Wet Prep HPF POC <10 <10   Sperm NONE SEEN   CBC     Status: Abnormal   Collection Time: 02/21/23  7:04 AM  Result Value Ref Range   WBC 14.0 (H) 4.0 - 10.5 K/uL   RBC 4.50 3.87 - 5.11 MIL/uL   Hemoglobin 12.9 12.0 - 15.0 g/dL   HCT 16.1 09.6 - 04.5 %    MCV 86.0 80.0 - 100.0 fL   MCH 28.7 26.0 - 34.0 pg   MCHC 33.3 30.0 - 36.0 g/dL   RDW 40.9 81.1 - 91.4 %   Platelets 272 150 - 400 K/uL   nRBC 0.0 0.0 - 0.2 %  RPR     Status: None   Collection Time: 02/21/23  7:04 AM  Result Value Ref Range   RPR Ser Ql NON REACTIVE NON REACTIVE  HIV Antibody (routine testing w rflx)     Status: None   Collection Time: 02/21/23  7:04 AM  Result Value Ref Range   HIV Screen 4th Generation wRfx Non Reactive Non Reactive  Hepatitis B surface antigen     Status: None   Collection Time: 02/21/23  7:04 AM  Result Value Ref Range   Hepatitis B Surface Ag NON REACTIVE NON REACTIVE  Hepatitis C antibody     Status: None   Collection Time: 02/21/23  7:04 AM  Result Value Ref Range   HCV Ab NON REACTIVE NON REACTIVE   Meds ordered this encounter  Medications   acetaminophen (TYLENOL) 325 MG tablet    Sig: Take 2 tablets (650 mg total) by mouth every 4 (four) hours as needed for up to 30 doses for moderate pain.    Dispense:  60 tablet    Refill:  0    Order Specific Question:   Supervising Provider    Answer:   Myna Hidalgo [[id number]]   ibuprofen (ADVIL) 600 MG tablet    Sig: Take 1 tablet (600 mg total) by mouth every 6 (six) hours as needed.    Dispense:  60 tablet    Refill:  3    Order Specific Question:   Supervising Provider    Answer:   Myna Hidalgo [[id number]]    Assessment and Plan  --22 y.o. [id number] s/p miscarriage --Bleeding stable in MAU --Hgb 12.9 --Patient with scant bleeding  --Discharge home in stable condition with strict return precautions  F/U: --Patient previously received care with [facility] (now [facility]) and plans to follow-up there --Advised miscarriage follow-up in 2-4 weeks  Clayton Bibles, MSA, MSN, CNM Certified Nurse Midwife, Chiropractor

## 2023-02-21 NOTE — MAU Note (Signed)
.  Danielle Sheppard is a 22 y.o. at Unknown here in MAU reporting having a miscarriage this am. Pt came in by EMS alert and oriented with minimal bleeding. Pt reports having an early miscarriage before 8wks in Dec. 2023. She thought she had a SAB 12/21/22 after having heavy bleeding and cramping and passing what looked like tissue. Pt awoke this am with severe cramps and strong urge to urinate. Went to the BR and had a BM and noted baby's feet hanging out. She stood up and the baby and placenta all came out. Pt reports no pain now.   LMP: unknown Onset of complaint: 0430 Pain score: 0 Vitals:   02/21/23 0619  BP: 137/82  Pulse: 95  Resp: 18  Temp: 98.2 F (36.8 C)  SpO2: 99%     FHT:na Lab orders placed from triage:

## 2023-02-22 ENCOUNTER — Other Ambulatory Visit: Payer: Self-pay | Admitting: Obstetrics and Gynecology

## 2023-02-22 DIAGNOSIS — O039 Complete or unspecified spontaneous abortion without complication: Secondary | ICD-10-CM

## 2023-02-23 LAB — GC/CHLAMYDIA PROBE AMP (~~LOC~~) NOT AT ARMC
Chlamydia: NEGATIVE
Comment: NEGATIVE
Comment: NORMAL
Neisseria Gonorrhea: NEGATIVE

## 2023-02-23 LAB — HEMOGLOBIN A1C
Hgb A1c MFr Bld: 4.8 % (ref 4.8–5.6)
Mean Plasma Glucose: 91 mg/dL

## 2023-02-23 LAB — RUBELLA SCREEN: Rubella: 3.91 index (ref >0.99)

## 2023-02-24 LAB — SURGICAL PATHOLOGY

## 2023-03-03 ENCOUNTER — Telehealth (HOSPITAL_COMMUNITY): Payer: Self-pay | Admitting: *Deleted

## 2023-03-03 NOTE — Telephone Encounter (Signed)
Mom reports feeling well physically and emotionally. Reports good support from family. Requested information regarding bereavement support group.   Duffy Rhody, RN 03-03-2023 at 9:54am

## 2024-02-19 ENCOUNTER — Encounter (HOSPITAL_COMMUNITY): Payer: Self-pay | Admitting: Emergency Medicine

## 2024-02-19 ENCOUNTER — Other Ambulatory Visit: Payer: Self-pay

## 2024-02-19 ENCOUNTER — Emergency Department (HOSPITAL_COMMUNITY)

## 2024-02-19 ENCOUNTER — Emergency Department (HOSPITAL_COMMUNITY)
Admission: EM | Admit: 2024-02-19 | Discharge: 2024-02-19 | Disposition: A | Discharge status: Home / Self Care | Attending: Emergency Medicine | Admitting: Emergency Medicine

## 2024-02-19 DIAGNOSIS — S91312A Laceration without foreign body, left foot, initial encounter: Secondary | ICD-10-CM | POA: Insufficient documentation

## 2024-02-19 DIAGNOSIS — S99922A Unspecified injury of left foot, initial encounter: Secondary | ICD-10-CM | POA: Diagnosis present

## 2024-02-19 DIAGNOSIS — W208XXA Other cause of strike by thrown, projected or falling object, initial encounter: Secondary | ICD-10-CM | POA: Insufficient documentation

## 2024-02-19 MED ORDER — HYDROCODONE-ACETAMINOPHEN 5-325 MG PO TABS
1.0000 | ORAL_TABLET | Freq: Once | ORAL | Status: AC
  Administered 2024-02-19: 1 via ORAL
  Filled 2024-02-19: qty 1

## 2024-02-19 MED ORDER — CEPHALEXIN 500 MG PO CAPS: 500.0000 mg | ORAL_CAPSULE | Freq: Four times a day (QID) | ORAL | 0 refills | Status: AC

## 2024-02-19 MED ORDER — LIDOCAINE-EPINEPHRINE (PF) 2 %-1:200000 IJ SOLN
20.0000 mL | Freq: Once | INTRAMUSCULAR | Status: AC
  Administered 2024-02-19: 20 mL
  Filled 2024-02-19: qty 20

## 2024-02-19 MED ORDER — HYDROCODONE-ACETAMINOPHEN 5-325 MG PO TABS
1.0000 | ORAL_TABLET | Freq: Once | ORAL | Status: AC
  Administered 2024-02-19: 1 via ORAL

## 2024-02-19 MED ORDER — HYDROCODONE-ACETAMINOPHEN 5-325 MG PO TABS: 2.0000 | ORAL_TABLET | ORAL | 0 refills | Status: AC | PRN

## 2024-02-19 MED ORDER — CEPHALEXIN 500 MG PO CAPS
500.0000 mg | ORAL_CAPSULE | Freq: Once | ORAL | Status: AC
  Administered 2024-02-19: 500 mg via ORAL
  Filled 2024-02-19: qty 1

## 2024-02-19 MED ORDER — HYDROCODONE-ACETAMINOPHEN 5-325 MG PO TABS
1.0000 | ORAL_TABLET | Freq: Once | ORAL | Status: DC
  Filled 2024-02-19: qty 1

## 2024-02-19 NOTE — ED Notes (Signed)
 Upon discharging patient, nonstick dressing placed on patient's foot for minor bleeding at site. Patient denied needing a wheelchair and ambulated at discharge. Upon exiting room, patient's L foot began to bleed profusely in the hallway. Patient was assisted back to room, and site assessed to have blood "spirting" from her foot. Pressure applied to site. ED provider at bedside to assess and perform a second laceration repair.

## 2024-02-19 NOTE — ED Notes (Signed)
 Patient reports feeling lightheaded. Vital signs checked and WNL. Patient currently in bed and provided cool rag to forehead

## 2024-02-19 NOTE — ED Triage Notes (Signed)
 Pt BIB [facility] from home for lac to top of left foot, pt dropped a mirror on foot, v/s 15/74, + cap refill, HR 91, RR 18, 99% RA

## 2024-02-19 NOTE — ED Notes (Signed)
 Nonstick dressing applied to L foot with pressure dressing. Foot elevated with pillow and ice pack applied to L foot

## 2024-02-19 NOTE — ED Provider Notes (Cosign Needed Addendum)
 [facility] AT [facility] Provider Note   CSN: [id number] Arrival date & time: 02/19/24  1821     History  Chief Complaint  Patient presents with   Laceration    Danielle Sheppard is a 23 y.o. female who presents with a laceration to the dorsum of the left foot after dropping a fragment of a mirror onto it.  He has a large hematoma, is very tender, and they describe spurting blood from the wound site before they wrapped it with a pressure dressing.  She presents with no other associated symptoms, does not have any dizziness or shortness of breath.  To her knowledge there is no remaining fragment remaining from the wound.   Laceration      Home Medications Prior to Admission medications   Medication Sig Start Date End Date Taking? Authorizing Provider  acetaminophen  (TYLENOL ) 325 MG tablet Take 2 tablets (650 mg total) by mouth every 4 (four) hours as needed for up to 30 doses for moderate pain. 02/21/23   Susana Enter, CNM  albuterol  (VENTOLIN  HFA) 108 (90 Base) MCG/ACT inhaler Inhale 2 puffs into the lungs every 4 (four) hours as needed for wheezing or shortness of breath. 07/01/22   Demaris Fillers, MD  escitalopram (LEXAPRO) 5 MG tablet Take by mouth. Patient not taking: Reported on 02/21/2023 10/26/22   [provider]  ibuprofen  (ADVIL ) 600 MG tablet Take 1 tablet (600 mg total) by mouth every 6 (six) hours as needed. 02/21/23   Susana Enter, CNM  predniSONE  (DELTASONE ) 50 MG tablet Take 1 tablet (50 mg total) by mouth daily with breakfast. 07/02/22   Demaris Fillers, MD      Allergies    Patient has no known allergies.    Review of Systems   Review of Systems  Skin:  Positive for wound.  All other systems reviewed and are negative.   Physical Exam Updated Vital Signs BP 122/83   Pulse (!) 105   Temp 98.2 F (36.8 C) (Oral)   Resp 18   Ht 5\' 8"  (1.727 m)   Wt 70.3 kg   LMP 02/02/2024 (Exact Date)   SpO2 98%   BMI 23.57  kg/m  Physical Exam Vitals and nursing note reviewed.  Constitutional:      General: She is not in acute distress.    Appearance: Normal appearance.  HENT:     Head: Normocephalic and atraumatic.     Mouth/Throat:     Mouth: Mucous membranes are moist.     Pharynx: Oropharynx is clear.  Eyes:     Extraocular Movements: Extraocular movements intact.     Conjunctiva/sclera: Conjunctivae normal.     Pupils: Pupils are equal, round, and reactive to light.  Cardiovascular:     Rate and Rhythm: Normal rate and regular rhythm.     Pulses:          Dorsalis pedis pulses are 2+ on the right side and 2+ on the left side.       Posterior tibial pulses are 2+ on the right side and 2+ on the left side.     Heart sounds: Normal heart sounds. No murmur heard.    No friction rub. No gallop.  Pulmonary:     Effort: Pulmonary effort is normal.     Breath sounds: Normal breath sounds.  Abdominal:     General: Abdomen is flat. Bowel sounds are normal.     Palpations: Abdomen is soft.  Musculoskeletal:  General: Normal range of motion.     Cervical back: Normal range of motion and neck supple.     Right lower leg: No edema.     Left lower leg: No edema.     Left foot: Laceration present.     Comments: Approximately 2 cm laceration to the dorsum of the left foot.  Notable hematoma is underlying.  No obvious fragments remaining out of the wound.  Skin:    General: Skin is warm and dry.     Capillary Refill: Capillary refill takes less than 2 seconds.  Neurological:     General: No focal deficit present.     Mental Status: She is alert. Mental status is at baseline.  Psychiatric:        Mood and Affect: Mood normal.     ED Results / Procedures / Treatments   Labs (all labs ordered are listed, but only abnormal results are displayed) Labs Reviewed - No data to display  EKG None  Radiology DG Foot Complete Left Result Date: 02/19/2024 CLINICAL DATA:  Laceration after dropping a  mirror on foot EXAM: LEFT FOOT - COMPLETE 3+ VIEW COMPARISON:  None Available. FINDINGS: Laceration to dorsum of the foot. No acute fracture or dislocation. No radiopaque foreign body. IMPRESSION: Laceration to the dorsum of the foot. No acute fracture or radiopaque foreign body. Electronically Signed   By: Rozell Cornet M.D.   On: 02/19/2024 19:25    Procedures .Laceration Repair  Date/Time: 02/19/2024 7:48 PM  Performed by: Juanetta Nordmann, PA Authorized by: Juanetta Nordmann, PA   Consent:    Consent obtained:  Verbal   Consent given by:  Patient   Risks, benefits, and alternatives were discussed: yes     Risks discussed:  Infection, pain, need for additional repair, poor cosmetic result and poor wound healing   Alternatives discussed:  No treatment, delayed treatment and observation Universal protocol:    Procedure explained and questions answered to patient or proxy's satisfaction: yes   Anesthesia:    Anesthesia method:  Local infiltration   Local anesthetic:  Lidocaine  2% WITH epi Laceration details:    Location:  Foot   Foot location:  Top of L foot   Length (cm):  2   Depth (mm):  2 Pre-procedure details:    Preparation:  Patient was prepped and draped in usual sterile fashion and imaging obtained to evaluate for foreign bodies Exploration:    Imaging obtained: x-ray     Imaging outcome: foreign body not noted     Wound extent: no tendon damage and no underlying fracture     Contaminated: no   Treatment:    Area cleansed with:  Chlorhexidine    Amount of cleaning:  Standard   Irrigation solution:  Sterile saline   Irrigation volume:  200   Irrigation method:  Syringe   Debridement:  None   Undermining:  None   Scar revision: no   Skin repair:    Repair method:  Sutures   Suture size:  4-0   Suture material:  Prolene   Suture technique:  Simple interrupted and retention suture   Number of sutures:  3 Approximation:    Approximation:  Close Repair type:     Repair type:  Simple Post-procedure details:    Dressing:  Antibiotic ointment and sterile dressing   Procedure completion:  Tolerated well, no immediate complications     Medications Ordered in ED Medications  HYDROcodone-acetaminophen  (NORCO/VICODIN) 5-325 MG per tablet  1 tablet (1 tablet Oral Not Given 02/19/24 1848)  lidocaine -EPINEPHrine  (XYLOCAINE  W/EPI) 2 %-1:200000 (PF) injection 20 mL (20 mLs Infiltration Given 02/19/24 1854)  HYDROcodone-acetaminophen  (NORCO/VICODIN) 5-325 MG per tablet 1 tablet (1 tablet Oral Given 02/19/24 1855)    ED Course/ Medical Decision Making/ A&P                                 Medical Decision Making Risk Prescription drug management.   Medical Decision Making:   Danielle Sheppard is a 23 y.o. female who presented to the ED today with laceration to top of left foot detailed above.     Complete initial physical exam performed, notably the patient was alert and oriented in no apparent distress.  Notable 2 cm laceration to the dorsum of the left foot with a substantial hematoma is noted.  Distal motor and sensory function is intact.     Reviewed and confirmed nursing documentation for past medical history, family history, social history.    Initial Assessment:   With the patient's presentation of laceration, most likely diagnosis is simple laceration.   Initial Plan:  Repair cutaneous injury with laceration repair as noted. Consult with vascular for potential arterial laceration Objective evaluation as below reviewed   Initial Study Results:     Radiology:  All images reviewed independently. Agree with radiology report at this time.   DG Foot Complete Left Result Date: 02/19/2024 CLINICAL DATA:  Laceration after dropping a mirror on foot EXAM: LEFT FOOT - COMPLETE 3+ VIEW COMPARISON:  None Available. FINDINGS: Laceration to dorsum of the foot. No acute fracture or dislocation. No radiopaque foreign body. IMPRESSION: Laceration to the  dorsum of the foot. No acute fracture or radiopaque foreign body. Electronically Signed   By: Rozell Cornet M.D.   On: 02/19/2024 19:25      Consults: Case discussed with vascular surgery who advised that no admission or surgical procedures be necessary from a vascular surgical standpoint.  They recommend referrals to orthopedics.  Reassessment and Plan:   After closure of the wound as noted, will manage outpatient with antibiotics as well as referral to orthopedics as previously noted. This has been explained extensively to the patient and she is in agreement has no further concerns at this time.          Final Clinical Impression(s) / ED Diagnoses Final diagnoses:  None    Rx / DC Orders ED Discharge Orders     None         Juanetta Nordmann, PA 02/19/24 1955    Juanetta Nordmann, [place] 02/19/24 2052    Cheyenne Cotta, MD 02/20/24 1039

## 2024-11-03 ENCOUNTER — Ambulatory Visit

## 2024-11-03 VITALS — BP 108/67 | HR 94 | Ht 68.0 in | Wt 156.0 lb

## 2024-11-03 DIAGNOSIS — O3680X0 Pregnancy with inconclusive fetal viability, not applicable or unspecified: Secondary | ICD-10-CM

## 2024-11-03 DIAGNOSIS — N926 Irregular menstruation, unspecified: Secondary | ICD-10-CM

## 2024-11-03 LAB — POCT URINE PREGNANCY: Preg Test, Ur: POSITIVE — AB

## 2024-11-03 NOTE — Patient Instructions (Signed)
 First Trimester of Pregnancy: What to Know  The first trimester of pregnancy starts on the first day of your last monthly period until the end of week 13. This is months 1 through 3 of pregnancy. A week after a sperm fertilizes an egg, the egg will implant into the wall of the uterus and begin to develop into a baby. Body changes during your first trimester Your body goes through many changes during pregnancy. The changes usually return to normal after your baby is born. Physical changes Your breasts may grow larger and may hurt. The area around your nipples may get darker. Your periods will stop. Your hair and nails may grow faster. You may pee more often. Health changes You may tire easily. Your gums may bleed and may be sensitive when you brush and floss. You may not feel hungry. You may have heartburn. You may throw up or feel like you may throw up. You may want to eat some foods, but not others. You may have headaches. You may have trouble pooping (constipation). Other changes Your emotions may change from day to day. You may have more dreams. Follow these instructions at home: Medicines Talk to your health care provider if you're taking medicines. Ask if the medicines are safe to take during pregnancy. Your provider may change the medicines that you take. Do not take any medicines unless told to by your provider. Take a prenatal vitamin that has at least 600 micrograms (mcg) of folic acid. Do not use herbal medicines, illegal substances, or medicines that are not approved by your provider. Eating and drinking While you're pregnant your body needs extra food for your growing baby. Talk with your provider about what to eat while pregnant. Activity Most women are able to exercise during pregnancy. Exercises may need to change as your pregnancy goes on. Talk to your provider about your activities and exercise routines. Relieving pain and discomfort Wear a good, supportive bra if  your breasts hurt. Rest with your legs raised if you have leg cramps or low back pain. Safety Wear your seatbelt at all times when you're in a car. Talk to your provider if someone hits you, hurts you, or yells at you. Talk with your provider if you're feeling sad or have thoughts of hurting yourself. Lifestyle Certain things can be harmful while you're pregnant. Follow these rules: Do not use hot tubs, steam rooms, or saunas. Do not douche. Do not use tampons or scented pads. Do not drink alcohol,smoke, vape, or use products with nicotine or tobacco in them. If you need help quitting, talk with your provider. Avoid cat litter boxes and soil used by cats. These things carry germs that can cause harm to your pregnancy and your baby. General instructions Keep all follow-up visits. It helps you and your unborn baby stay as healthy as possible. Write down your questions. Take them to your visits. Your provider will: Talk with you about your overall health. Give you advice or refer you to specialists who can help with different needs, including: Prenatal education classes. Mental health and counseling. Foods and healthy eating. Ask for help if you need help with food. Call your dentist and ask to be seen. Brush your teeth with a soft toothbrush. Floss gently. Where to find more information American Pregnancy Association: americanpregnancy.org [employer]: acog.org Office on [employer] Health: [place].ca Contact a health care provider if: You feel dizzy, faint, or have a fever. You vomit or have watery poop (  diarrhea) for 2 days or more. You have abnormal discharge or bleeding from your vagina. You have pain when you pee or your pee smells bad. You have cramps, pain, or pressure in your belly area. Get help right away if: You have trouble breathing or chest pain. You have any kind of injury, such as from a fall or a car crash. These symptoms may  be an emergency. Get help right away. Call 911. Do not wait to see if the symptoms will go away. Do not drive yourself to the hospital. This information is not intended to replace advice given to you by your health care provider. Make sure you discuss any questions you have with your health care provider. Document Revised: 07/24/2024 Document Reviewed: 01/15/2023 Elsevier Patient Education  2025 [employer].

## 2024-11-03 NOTE — Progress Notes (Signed)
" ° ° °  NURSE VISIT NOTE  Subjective:    Patient ID: Danielle Sheppard, female    DOB: 12/23/2000, 24 y.o.   MRN: [id number]  HPI  Patient is a 24 y.o. [phone] female who presents for evaluation of amenorrhea. She believes she could be pregnant. Pregnancy is desired. Sexual Activity: single partner, contraception: none. Current symptoms also include: morning sickness, nausea, and positive home pregnancy test. Last period was normal.    Objective:    BP 108/67   Pulse 94   Ht 5' 8 (1.727 m)   Wt 156 lb (70.8 kg)   LMP 09/16/2024 (Approximate)   Breastfeeding No   BMI 23.72 kg/m   Lab Review  No results found for any visits on 11/03/24.  Assessment:   1. Missed period   2. Encounter to determine fetal viability of pregnancy, single or unspecified fetus     Plan:   Pregnancy Test: Positive  Encouraged well-balanced diet, plenty of rest when needed, pre-natal vitamins daily and walking for exercise.  Discussed self-help for nausea, avoiding OTC medications until consulting provider or pharmacist, other than Tylenol  as needed, minimal caffeine (1-2 cups daily) and avoiding alcohol.   She will schedule her nurse visit @ 7-[redacted] wks pregnant, u/s for dating @10  wk, and NOB visit at [redacted] wk pregnant.    Feel free to call with any questions.     Rollo FORBES Louder, RN   "

## 2024-11-17 ENCOUNTER — Telehealth

## 2024-11-24 ENCOUNTER — Other Ambulatory Visit

## 2024-12-08 ENCOUNTER — Encounter: Admitting: Certified Nurse Midwife
